# Patient Record
Sex: Female | Born: 1965 | Race: White | Hispanic: No | Marital: Married | Smoking: Never smoker
Health system: Southern US, Community
[De-identification: ages and names within clinical notes are randomized; demographics above are authoritative.]

## PROBLEM LIST (undated history)

## (undated) DIAGNOSIS — G4700 Insomnia, unspecified: Secondary | ICD-10-CM

## (undated) HISTORY — PX: CHOLECYSTECTOMY: SHX55

## (undated) HISTORY — PX: ABDOMINAL HYSTERECTOMY: SHX81

---

## 2013-05-24 ENCOUNTER — Emergency Department (HOSPITAL_COMMUNITY)
Admission: EM | Admit: 2013-05-24 | Discharge: 2013-05-24 | Disposition: A | Discharge status: Home / Self Care | Payer: Self-pay | Attending: Emergency Medicine | Admitting: Emergency Medicine

## 2013-05-24 ENCOUNTER — Emergency Department (HOSPITAL_COMMUNITY): Payer: Self-pay

## 2013-05-24 ENCOUNTER — Encounter (HOSPITAL_COMMUNITY): Payer: Self-pay | Admitting: Neurology

## 2013-05-24 DIAGNOSIS — Z880 Allergy status to penicillin: Secondary | ICD-10-CM | POA: Insufficient documentation

## 2013-05-24 DIAGNOSIS — R197 Diarrhea, unspecified: Secondary | ICD-10-CM | POA: Insufficient documentation

## 2013-05-24 DIAGNOSIS — R002 Palpitations: Secondary | ICD-10-CM | POA: Insufficient documentation

## 2013-05-24 DIAGNOSIS — R110 Nausea: Secondary | ICD-10-CM | POA: Insufficient documentation

## 2013-05-24 DIAGNOSIS — R079 Chest pain, unspecified: Secondary | ICD-10-CM | POA: Insufficient documentation

## 2013-05-24 DIAGNOSIS — Z79899 Other long term (current) drug therapy: Secondary | ICD-10-CM | POA: Insufficient documentation

## 2013-05-24 LAB — COMPREHENSIVE METABOLIC PANEL
ALT: 12 U/L (ref 0–35)
AST: 19 U/L (ref 0–37)
CO2: 29 mEq/L (ref 19–32)
Calcium: 9.1 mg/dL (ref 8.4–10.5)
GFR calc non Af Amer: 79 mL/min — ABNORMAL LOW (ref >90)
Sodium: 140 mEq/L (ref 135–145)
Total Protein: 6.4 g/dL (ref 6.0–8.3)

## 2013-05-24 LAB — POCT I-STAT, CHEM 8
BUN: 15 mg/dL (ref 6–23)
Chloride: 104 mEq/L (ref 96–112)
Creatinine, Ser: 1 mg/dL (ref 0.50–1.10)
Glucose, Bld: 76 mg/dL (ref 70–99)
Potassium: 3.6 mEq/L (ref 3.5–5.1)

## 2013-05-24 LAB — POCT I-STAT TROPONIN I: Troponin i, poc: 0 ng/mL (ref 0.00–0.08)

## 2013-05-24 LAB — CBC
MCH: 28.3 pg (ref 26.0–34.0)
Platelets: 211 10*3/uL (ref 150–400)
RBC: 4.38 MIL/uL (ref 3.87–5.11)

## 2013-05-24 MED ORDER — ACETAMINOPHEN 325 MG PO TABS
650.0000 mg | ORAL_TABLET | Freq: Once | ORAL | Status: AC
  Administered 2013-05-24: 650 mg via ORAL
  Filled 2013-05-24: qty 2

## 2013-05-24 MED ORDER — GI COCKTAIL ~~LOC~~
30.0000 mL | Freq: Once | ORAL | Status: AC
  Administered 2013-05-24: 30 mL via ORAL
  Filled 2013-05-24: qty 30

## 2013-05-24 MED ORDER — ONDANSETRON 4 MG PO TBDP
8.0000 mg | ORAL_TABLET | Freq: Once | ORAL | Status: AC
  Administered 2013-05-24: 8 mg via ORAL

## 2013-05-24 MED ORDER — ONDANSETRON 4 MG PO TBDP
ORAL_TABLET | ORAL | Status: AC
  Filled 2013-05-24: qty 2

## 2013-05-24 NOTE — ED Provider Notes (Signed)
History     CSN: [id number]  Arrival date & time 05/24/13  1413   First MD Initiated Contact with Patient 05/24/13 1414      Chief Complaint  Patient presents with  . Chest Pain    (Consider location/radiation/quality/duration/timing/severity/associated sxs/prior treatment) Patient is a 47 y.o. female presenting with chest pain.  Chest Pain Pain location:  Substernal area Pain quality: stabbing   Pain radiates to:  L shoulder Pain radiates to the back: no   Pain severity:  Severe Onset quality:  Gradual Timing:  Intermittent Progression:  Partially resolved Chronicity:  New Context: breathing   Context: not lifting, no movement, not at rest and no stress   Relieved by:  Nitroglycerin Worsened by:  Nothing tried Ineffective treatments:  None tried Associated symptoms: nausea and palpitations   Associated symptoms: no abdominal pain, no anxiety, no back pain, no cough, no dizziness, no dysphagia, no fatigue, no fever, no numbness, no shortness of breath and not vomiting     History reviewed. No pertinent past medical history.  Past Surgical History  Procedure Laterality Date  . Abdominal hysterectomy      No family history on file.  History  Substance Use Topics  . Smoking status: Not on file  . Smokeless tobacco: Not on file  . Alcohol Use: Not on file    OB History   Grav Para Term Preterm Abortions TAB SAB Ect Mult Living                  Review of Systems  Constitutional: Negative for fever, chills and fatigue.  HENT: Negative for congestion, sore throat, rhinorrhea and trouble swallowing.   Eyes: Negative for photophobia and visual disturbance.  Respiratory: Negative for cough and shortness of breath.   Cardiovascular: Positive for chest pain and palpitations. Negative for leg swelling.  Gastrointestinal: Positive for nausea and diarrhea. Negative for vomiting, abdominal pain and constipation.  Endocrine: Negative for polyphagia and polyuria.   Genitourinary: Negative for dysuria, flank pain, vaginal bleeding, vaginal discharge and enuresis.  Musculoskeletal: Negative for back pain and gait problem.  Skin: Negative for color change and rash.  Neurological: Negative for dizziness, syncope, light-headedness and numbness.  Hematological: Negative for adenopathy. Does not bruise/bleed easily.  All other systems reviewed and are negative.    Allergies  Penicillins  Home Medications   Current Outpatient Rx  Name  Route  Sig  Dispense  Refill  . cholecalciferol (VITAMIN D) 1000 UNITS tablet   Oral   Take 1,000 Units by mouth daily.         . Multiple Vitamin (MULTIVITAMIN WITH MINERALS) TABS   Oral   Take 1 tablet by mouth daily.         Marland Kitchen PRESCRIPTION MEDICATION   Oral   Take 1 tablet by mouth daily as needed (for fluid). Fluid pill         . vitamin B-12 (CYANOCOBALAMIN) 1000 MCG tablet   Oral   Take 1,000 mcg by mouth daily.           BP 124/63  Pulse 75  Temp(Src) 98.2 F (36.8 C) (Oral)  Resp 20  SpO2 100%  Physical Exam  Vitals reviewed. Constitutional: She is oriented to person, place, and time. She appears well-developed and well-nourished.  HENT:  Head: Normocephalic and atraumatic.  Right Ear: External ear normal.  Left Ear: External ear normal.  Eyes: Conjunctivae and EOM are normal. Pupils are equal, round, and reactive to light.  Neck: Normal range of motion. Neck supple.  Cardiovascular: Normal rate, regular rhythm, normal heart sounds and intact distal pulses.   Pulmonary/Chest: Effort normal and breath sounds normal.  Abdominal: Soft. Bowel sounds are normal. There is no tenderness.  Musculoskeletal: Normal range of motion.  Neurological: She is alert and oriented to person, place, and time.  Skin: Skin is warm and dry.    ED Course  Procedures (including critical care time)  Labs Reviewed  CBC  COMPREHENSIVE METABOLIC PANEL  LIPASE, BLOOD   No results found.   No  diagnosis found.   Date: 05/24/2013  Rate: 73  Rhythm: normal sinus rhythm  QRS Axis: normal  Intervals: normal  ST/T Wave abnormalities: normal  Conduction Disutrbances:none  Narrative Interpretation: NSR  Old EKG Reviewed: none available     MDM  47 y.o. female  with pertinent PMH of none presents with chest pain beginning 3 days ago, pleuritic, nonexertional, as described above.  No ho similar.  Pt has also had diarrhea for past 1 week, no nausea.  No fever, cough, or other pain.  Pt states she thinks it might be the heat.  Physical exam as above.  Will check enzymes, cxr, ecg.  Doubt PE: pain intermittent, not associated with each breath, and not exertional, PERC negative.    Pt care transferred to Dr. Fayrene Fearing pending final dispo and delta trop.  Labs and imaging as above reviewed by myself and attending,Dr. Blinda Leatherwood, with whom case was discussed.           Noel Gerold, MD 05/24/13 [phone]

## 2013-05-24 NOTE — ED Provider Notes (Signed)
History     CSN: [id number]  Arrival date & time 05/24/13  1413   First MD Initiated Contact with Patient 05/24/13 1414      Chief Complaint  Patient presents with  . Chest Pain    (Consider location/radiation/quality/duration/timing/severity/associated sxs/prior treatment) HPI  History reviewed. No pertinent past medical history.  Past Surgical History  Procedure Laterality Date  . Abdominal hysterectomy      No family history on file.  History  Substance Use Topics  . Smoking status: Not on file  . Smokeless tobacco: Not on file  . Alcohol Use: Not on file    OB History   Grav Para Term Preterm Abortions TAB SAB Ect Mult Living                  Review of Systems  Allergies  Penicillins  Home Medications   Current Outpatient Rx  Name  Route  Sig  Dispense  Refill  . cholecalciferol (VITAMIN D) 1000 UNITS tablet   Oral   Take 1,000 Units by mouth daily.         . Multiple Vitamin (MULTIVITAMIN WITH MINERALS) TABS   Oral   Take 1 tablet by mouth daily.         Marland Kitchen PRESCRIPTION MEDICATION   Oral   Take 1 tablet by mouth daily as needed (for fluid). Fluid pill         . vitamin B-12 (CYANOCOBALAMIN) 1000 MCG tablet   Oral   Take 1,000 mcg by mouth daily.           BP 124/63  Pulse 75  Temp(Src) 98.2 F (36.8 C) (Oral)  Resp 20  SpO2 100%  Physical Exam  ED Course  Procedures (including critical care time)  Labs Reviewed  CBC - Abnormal; Notable for the following:    HCT 35.9 (*)    All other components within normal limits  COMPREHENSIVE METABOLIC PANEL - Abnormal; Notable for the following:    GFR calc non Af Amer 79 (*)    All other components within normal limits  LIPASE, BLOOD  POCT I-STAT, CHEM 8  POCT I-STAT TROPONIN I  POCT I-STAT TROPONIN I   Dg Chest 2 View  05/24/2013   *RADIOLOGY REPORT*  Clinical Data: Chest pain  CHEST - 2 VIEW  Comparison: 10/20/2011  Findings: The heart size and mediastinal contours are  within normal limits.  Both lungs are clear.  The visualized skeletal structures are unremarkable.  IMPRESSION: Negative exam.   Original Report Authenticated By: Signa Kell, M.D.     No diagnosis found.    MDM  4:12 PM care assumed from Dr. Littie Deeds of this pt here with intermittent sharp chest pain. Was given nitro en route with improvement in pain. Overall pt is low risk and story does not sound c/w ACS. Initial tn neg. Will f/u delta tn.   7:01 PM delta tn neg. Pt has remained chest pain free. She will call her PCP tomorrow for recheck and further evaluation of chest pain. She voiced understanding and dc'd home in stable condition.       Caren Hazy, MD 05/24/13 1901

## 2013-05-24 NOTE — ED Notes (Signed)
Per EMS- Reporting central cp x 3 days intermittently. Skin warm and dry. Today initially 10/10 while at work. Reports nausea, no cardiac hx. Lung sounds clear, reports radiation to left side. Given 4 mg zofran, 3 nitro, after nitro 5/10 cp. 324 aspirin. Reports pain worse with deep inspiration. 130/90, HR 74, RR 20. A x 4.

## 2013-05-24 NOTE — ED Provider Notes (Signed)
I saw and evaluated the patient, reviewed the resident's note and I agree with the findings and plan.  Seen and evaluated. Patient's chest pain symptoms are atypical. Patient is experiencing intermittent pain for 3 days that is nonexertional in nature. Patient has no cardiac risk factors. She is very low probability for coronary artery disease causing her symptoms. It was felt it was appropriate to have 2 sets of enzymes here in the ER, if negative followup as an outpatient.  Agree with resident interpretation of EKG.  Gilda Crease, MD 05/24/13 1743

## 2013-05-27 NOTE — ED Provider Notes (Signed)
I saw and evaluated the patient, reviewed the resident's note and I agree with the findings and plan.    Gilda Crease, MD 05/27/13 [phone]

## 2014-06-13 ENCOUNTER — Emergency Department (HOSPITAL_COMMUNITY): Payer: BC Managed Care – PPO

## 2014-06-13 ENCOUNTER — Encounter (HOSPITAL_COMMUNITY): Payer: Self-pay | Admitting: Emergency Medicine

## 2014-06-13 ENCOUNTER — Emergency Department (HOSPITAL_COMMUNITY)
Admission: EM | Admit: 2014-06-13 | Discharge: 2014-06-13 | Disposition: A | Discharge status: Home / Self Care | Payer: BC Managed Care – PPO | Attending: Emergency Medicine | Admitting: Emergency Medicine

## 2014-06-13 DIAGNOSIS — IMO0002 Reserved for concepts with insufficient information to code with codable children: Secondary | ICD-10-CM | POA: Insufficient documentation

## 2014-06-13 DIAGNOSIS — R61 Generalized hyperhidrosis: Secondary | ICD-10-CM | POA: Insufficient documentation

## 2014-06-13 DIAGNOSIS — R5383 Other fatigue: Principal | ICD-10-CM

## 2014-06-13 DIAGNOSIS — R609 Edema, unspecified: Secondary | ICD-10-CM | POA: Insufficient documentation

## 2014-06-13 DIAGNOSIS — T380X5A Adverse effect of glucocorticoids and synthetic analogues, initial encounter: Secondary | ICD-10-CM | POA: Insufficient documentation

## 2014-06-13 DIAGNOSIS — Z792 Long term (current) use of antibiotics: Secondary | ICD-10-CM | POA: Insufficient documentation

## 2014-06-13 DIAGNOSIS — R5381 Other malaise: Secondary | ICD-10-CM | POA: Insufficient documentation

## 2014-06-13 DIAGNOSIS — Z79899 Other long term (current) drug therapy: Secondary | ICD-10-CM | POA: Insufficient documentation

## 2014-06-13 DIAGNOSIS — Z880 Allergy status to penicillin: Secondary | ICD-10-CM | POA: Insufficient documentation

## 2014-06-13 DIAGNOSIS — N390 Urinary tract infection, site not specified: Secondary | ICD-10-CM | POA: Insufficient documentation

## 2014-06-13 DIAGNOSIS — R42 Dizziness and giddiness: Secondary | ICD-10-CM | POA: Insufficient documentation

## 2014-06-13 DIAGNOSIS — R0602 Shortness of breath: Secondary | ICD-10-CM | POA: Insufficient documentation

## 2014-06-13 DIAGNOSIS — R300 Dysuria: Secondary | ICD-10-CM | POA: Insufficient documentation

## 2014-06-13 DIAGNOSIS — Z87440 Personal history of urinary (tract) infections: Secondary | ICD-10-CM

## 2014-06-13 LAB — COMPREHENSIVE METABOLIC PANEL
ALT: 13 U/L (ref 0–35)
AST: 17 U/L (ref 0–37)
Albumin: 3.5 g/dL (ref 3.5–5.2)
Alkaline Phosphatase: 111 U/L (ref 39–117)
Anion gap: 15 (ref 5–15)
BUN: 15 mg/dL (ref 6–23)
CALCIUM: 9 mg/dL (ref 8.4–10.5)
CO2: 24 meq/L (ref 19–32)
CREATININE: 0.66 mg/dL (ref 0.50–1.10)
Chloride: 102 mEq/L (ref 96–112)
Glucose, Bld: 119 mg/dL — ABNORMAL HIGH (ref 70–99)
Potassium: 4 mEq/L (ref 3.7–5.3)
Sodium: 141 mEq/L (ref 137–147)
TOTAL PROTEIN: 6.5 g/dL (ref 6.0–8.3)
Total Bilirubin: 0.3 mg/dL (ref 0.3–1.2)

## 2014-06-13 LAB — CBC WITH DIFFERENTIAL/PLATELET
BASOS ABS: 0 10*3/uL (ref 0.0–0.1)
Basophils Relative: 0 % (ref 0–1)
EOS ABS: 0.1 10*3/uL (ref 0.0–0.7)
EOS PCT: 1 % (ref 0–5)
HEMATOCRIT: 40.3 % (ref 36.0–46.0)
Hemoglobin: 13.6 g/dL (ref 12.0–15.0)
LYMPHS PCT: 14 % (ref 12–46)
Lymphs Abs: 2.3 10*3/uL (ref 0.7–4.0)
MCH: 28.5 pg (ref 26.0–34.0)
MCHC: 33.7 g/dL (ref 30.0–36.0)
MCV: 84.3 fL (ref 78.0–100.0)
MONO ABS: 0.6 10*3/uL (ref 0.1–1.0)
Monocytes Relative: 3 % (ref 3–12)
Neutro Abs: 14.1 10*3/uL — ABNORMAL HIGH (ref 1.7–7.7)
Neutrophils Relative %: 82 % — ABNORMAL HIGH (ref 43–77)
PLATELETS: 216 10*3/uL (ref 150–400)
RBC: 4.78 MIL/uL (ref 3.87–5.11)
RDW: 14 % (ref 11.5–15.5)
WBC: 17 10*3/uL — ABNORMAL HIGH (ref 4.0–10.5)

## 2014-06-13 LAB — URINALYSIS, ROUTINE W REFLEX MICROSCOPIC
BILIRUBIN URINE: NEGATIVE
GLUCOSE, UA: NEGATIVE mg/dL
Hgb urine dipstick: NEGATIVE
KETONES UR: NEGATIVE mg/dL
Leukocytes, UA: NEGATIVE
Nitrite: NEGATIVE
Protein, ur: NEGATIVE mg/dL
Specific Gravity, Urine: 1.024 (ref 1.005–1.030)
Urobilinogen, UA: 1 mg/dL (ref 0.0–1.0)
pH: 6.5 (ref 5.0–8.0)

## 2014-06-13 LAB — LACTIC ACID, PLASMA: Lactic Acid, Venous: 1.7 mmol/L (ref 0.5–2.2)

## 2014-06-13 MED ORDER — PHENAZOPYRIDINE HCL 200 MG PO TABS: 200.0000 mg | ORAL_TABLET | Freq: Three times a day (TID) | ORAL | Status: AC | PRN

## 2014-06-13 MED ORDER — ACETAMINOPHEN 325 MG PO TABS
650.0000 mg | ORAL_TABLET | Freq: Once | ORAL | Status: AC
  Administered 2014-06-13: 650 mg via ORAL
  Filled 2014-06-13: qty 2

## 2014-06-13 MED ORDER — SODIUM CHLORIDE 0.9 % IV BOLUS (SEPSIS)
1000.0000 mL | Freq: Once | INTRAVENOUS | Status: AC
Start: 2014-06-13 — End: 2014-06-13
  Administered 2014-06-13: 1000 mL via INTRAVENOUS

## 2014-06-13 NOTE — ED Notes (Signed)
Dr Beaton at bedside 

## 2014-06-13 NOTE — ED Notes (Signed)
Pt reports generalized weakness x 1 week.  Seen by PCP last week, dx with kidney infection.  Pt given "sulfa- drug;" reports tightness in throat so PCP stopped antibiotics for possible allergic reaction. Prednisone given after allergic reaction. Pt c/o weakness, diaphoresis worsened today. Reports sitting at desk at work when felt lightheaded, increased shortness of breath, and diaphoretic. Also c/o decreased appetite and nausea.

## 2014-06-13 NOTE — ED Notes (Signed)
Pt having increased shortness of breath after ambulating.  Respirations 30pm afte ambulation. Pt reports "my legs feel like 50 lb weights. I've never felt so weak." Resident aware of patient condition

## 2014-06-13 NOTE — ED Notes (Addendum)
Pt presents with 10 day h/o generalized weakness. Pt seen at PCP, had medication changed, [facility], was told she had a kidney infection.  Pt reports today, symptoms worsened.  Pt reports at onset, PCP thought pt had allergic reaction, was put on prednisone; pt reports intermittent shortness of breath.  Pt reports tick bite to L thigh x 1 week ago

## 2014-06-13 NOTE — ED Notes (Signed)
Dr.Ahmed, Resident at bedside

## 2014-06-13 NOTE — ED Provider Notes (Signed)
I saw and evaluated the patient, reviewed the resident's note and I agree with the findings and plan.   .Face to face Exam:  General:  Awake HEENT:  Atraumatic Resp:  Normal effort Abd:  Nondistended Neuro:No focal weakness  Nelia Shiobert L Lewin Pellow, MD 06/13/14 [phone]

## 2014-06-13 NOTE — Discharge Instructions (Signed)

## 2014-06-13 NOTE — ED Provider Notes (Signed)
CSN: [id number]     Arrival date & time 06/13/14  1325 History   First MD Initiated Contact with Patient 06/13/14 1504     No chief complaint on file.    (Consider location/radiation/quality/duration/timing/severity/associated sxs/prior Treatment) HPI  Patient is 48 yo female with recent UTI diagnosed by PCP who comes in with generalized weakness, diaphoresis, dizziness, and SOB. She was dxed with UTI by her PCP on 06/03/14, was started on a sulfa abx. She developed allergic reaction to the abx and went to urgent care on 06/04/14. Her abx was changed to cipro and she was started on PO prednisone. She continued to feel that she was having allergic reaction and felt her body was burning. Went to ED on 06/07/14 and received IV steroid and was told to continue her abx. She went back to ED on 06/11/14 for persistent feeling of malaise and burning/pain with voiding. Per patient, CT scan was done and it showed infection of the kidney. She was told to wean off prednisone and to continue abx.   Currently patient is taking 1 prednisone tablet daily and cipro 500mg  BID. She continues to have pain with urination and foul smelling urine, pain on her left flank, headache, diaphoresis, generalized weakness, nausea, and malaise.   History reviewed. No pertinent past medical history. Past Surgical History  Procedure Laterality Date  . Abdominal hysterectomy     History reviewed. No pertinent family history. History  Substance Use Topics  . Smoking status: Never Smoker   . Smokeless tobacco: Not on file  . Alcohol Use: Not on file   OB History   Grav Para Term Preterm Abortions TAB SAB Ect Mult Living                 Review of Systems  Constitutional: Positive for diaphoresis, appetite change and fatigue.  HENT: Positive for congestion. Negative for ear discharge, ear pain, facial swelling, postnasal drip and sinus pressure.   Eyes: Negative for pain, discharge and redness.  Respiratory: Positive for  shortness of breath. Negative for apnea, cough, chest tightness and wheezing.   Cardiovascular: Positive for leg swelling. Negative for chest pain and palpitations.  Genitourinary: Positive for dysuria, flank pain, difficulty urinating and pelvic pain. Negative for vaginal bleeding and vaginal discharge.  Musculoskeletal: Negative for back pain, joint swelling, myalgias, neck pain and neck stiffness.  Skin: Positive for rash.  Allergic/Immunologic: Negative.   Neurological: Positive for dizziness, light-headedness and headaches. Negative for tremors, seizures, syncope, facial asymmetry, speech difficulty and numbness.  Psychiatric/Behavioral: Negative for hallucinations, confusion and agitation.      Allergies  Penicillins and Sulfa antibiotics  Home Medications   Prior to Admission medications   Medication Sig Start Date End Date Taking? Authorizing Provider  cholecalciferol (VITAMIN D) 1000 UNITS tablet Take 1,000 Units by mouth daily.   Yes Historical Provider, MD  Cimetidine (TAGAMET PO) Take 1 tablet by mouth 2 (two) times daily.   Yes Historical Provider, MD  ciprofloxacin (CIPRO) 500 MG tablet Take 500 mg by mouth 2 (two) times daily. 8 days left   Yes Historical Provider, MD  clindamycin (CLEOCIN) 150 MG capsule Take 150 mg by mouth 3 (three) times daily.   Yes Historical Provider, MD  diphenhydrAMINE (BENADRYL) 25 MG tablet Take 25 mg by mouth every 4 (four) hours as needed.   Yes Historical Provider, MD  furosemide (LASIX) 20 MG tablet Take 20 mg by mouth daily.   Yes Historical Provider, MD  Multiple Vitamin (MULTIVITAMIN WITH  MINERALS) TABS Take 1 tablet by mouth daily.   Yes Historical Provider, MD  phentermine (ADIPEX-P) 37.5 MG tablet Take 37.5 mg by mouth daily before breakfast.   Yes Historical Provider, MD  predniSONE (DELTASONE) 10 MG tablet Take 10 mg by mouth daily with breakfast.   Yes Historical Provider, MD   BP 123/65  Pulse 69  Temp(Src) 97.7 F (36.5 C)  (Oral)  Resp 24  SpO2 98% Physical Exam  Nursing note and vitals reviewed. Constitutional: She is oriented to person, place, and time. She appears well-developed and well-nourished. No distress.  HENT:  Head: Normocephalic and atraumatic.  Right Ear: External ear normal.  Left Ear: External ear normal.  Nose: Nose normal.  Mouth/Throat: Oropharynx is clear and moist.  Eyes: Conjunctivae and EOM are normal. Pupils are equal, round, and reactive to light. Right eye exhibits no discharge. Left eye exhibits no discharge. No scleral icterus.  Neck: Normal range of motion. Neck supple. No JVD present. No thyromegaly present.  Cardiovascular: Normal rate, regular rhythm, normal heart sounds and intact distal pulses.  Exam reveals no gallop and no friction rub.   No murmur heard. Pulmonary/Chest: Effort normal and breath sounds normal. No respiratory distress. She has no wheezes. She has no rales. She exhibits no tenderness.  Abdominal: Soft. Bowel sounds are normal. She exhibits no distension and no mass. There is tenderness in the suprapubic area. There is no rebound and no guarding.  CVA tenderness noted on Left.   Musculoskeletal: She exhibits no edema and no tenderness.  Bilateral 1+ pitting edema on lower extremities.  Lymphadenopathy:    She has no cervical adenopathy.  Neurological: She is alert and oriented to person, place, and time. She has normal strength and normal reflexes. No cranial nerve deficit or sensory deficit.  Skin: Skin is warm. No rash noted. She is diaphoretic. No cyanosis. No pallor.  Psychiatric: She has a normal mood and affect. Her behavior is normal.    ED Course  Procedures (including critical care time) Labs Review Labs Reviewed  CBC WITH DIFFERENTIAL - Abnormal; Notable for the following:    WBC 17.0 (*)    Neutrophils Relative % 82 (*)    Neutro Abs 14.1 (*)    All other components within normal limits  COMPREHENSIVE METABOLIC PANEL - Abnormal; Notable  for the following:    Glucose, Bld 119 (*)    All other components within normal limits  URINE CULTURE  URINALYSIS, ROUTINE W REFLEX MICROSCOPIC  LACTIC ACID, PLASMA    Imaging Review Dg Chest 2 View  06/13/2014   CLINICAL DATA:  Low grade fever.  EXAM: CHEST  2 VIEW  COMPARISON:  CT chest and PA and lateral chest 06/11/2014.  FINDINGS: Heart size and mediastinal contours are within normal limits. Both lungs are clear. Visualized skeletal structures are unremarkable.  IMPRESSION: Negative exam.   Electronically Signed   By: Drusilla Kannerhomas  Dalessio M.D.   On: 06/13/2014 14:40     EKG Interpretation None     EKG was NSR. Vitals are normal. Not having chest pain - didn't do trp.  CXR was normal.  CBC showed leukocytosis 17 with left shift.  CMP was normal.   Ordered UA and lactic acid. Bolused 1 L fluid. Gave tylenol for HA.   Looked at old records form [place], ct angio there ws negative, WBC was 20's there on 06/11/14.  Will order ct Abd pelvis w/o contrast to evaluate for ongoing dysuria.  CT was negative.  MDM   Final diagnoses:  None   Her overall feeling of malaise is likely the side effect of prednisone. Her WBC increased to 20's after starting prednisone per previous records and it's elevated here to 17. Other labs look normal. Will stop prednisone. Continue cipro to finish her dose for UTI.  She continues to have dysuria despite UTI tx. She has hx of UTI 2-3 months ago. Will refer to urology outpatient to evaluation of recurrent symptomatic UTI.    Hyacinth Meeker, MD 06/13/14 [id number]  Hyacinth Meeker, MD 06/13/14 1191

## 2014-06-14 LAB — URINE CULTURE
COLONY COUNT: NO GROWTH
CULTURE: NO GROWTH

## 2014-07-20 ENCOUNTER — Emergency Department (HOSPITAL_BASED_OUTPATIENT_CLINIC_OR_DEPARTMENT_OTHER)
Admission: EM | Admit: 2014-07-20 | Discharge: 2014-07-20 | Disposition: A | Discharge status: Home / Self Care | Payer: BC Managed Care – PPO | Attending: Emergency Medicine | Admitting: Emergency Medicine

## 2014-07-20 ENCOUNTER — Encounter (HOSPITAL_BASED_OUTPATIENT_CLINIC_OR_DEPARTMENT_OTHER): Payer: Self-pay | Admitting: Emergency Medicine

## 2014-07-20 DIAGNOSIS — IMO0002 Reserved for concepts with insufficient information to code with codable children: Secondary | ICD-10-CM | POA: Insufficient documentation

## 2014-07-20 DIAGNOSIS — Z79899 Other long term (current) drug therapy: Secondary | ICD-10-CM | POA: Insufficient documentation

## 2014-07-20 DIAGNOSIS — L259 Unspecified contact dermatitis, unspecified cause: Secondary | ICD-10-CM | POA: Diagnosis not present

## 2014-07-20 DIAGNOSIS — Y9389 Activity, other specified: Secondary | ICD-10-CM | POA: Insufficient documentation

## 2014-07-20 DIAGNOSIS — Z880 Allergy status to penicillin: Secondary | ICD-10-CM | POA: Diagnosis not present

## 2014-07-20 DIAGNOSIS — R21 Rash and other nonspecific skin eruption: Secondary | ICD-10-CM | POA: Insufficient documentation

## 2014-07-20 DIAGNOSIS — Z792 Long term (current) use of antibiotics: Secondary | ICD-10-CM | POA: Diagnosis not present

## 2014-07-20 DIAGNOSIS — Y929 Unspecified place or not applicable: Secondary | ICD-10-CM | POA: Diagnosis not present

## 2014-07-20 DIAGNOSIS — B369 Superficial mycosis, unspecified: Secondary | ICD-10-CM

## 2014-07-20 DIAGNOSIS — Z87440 Personal history of urinary (tract) infections: Secondary | ICD-10-CM | POA: Insufficient documentation

## 2014-07-20 MED ORDER — PREDNISONE 10 MG PO TABS: ORAL_TABLET | ORAL | Status: AC

## 2014-07-20 MED ORDER — NYSTATIN 100000 UNIT/GM EX POWD: 1.0000 g | Freq: Two times a day (BID) | CUTANEOUS | Status: AC

## 2014-07-20 NOTE — ED Notes (Signed)
Pt states she got bit by something Tuesday on left arm. Now has rash on AC area, and on left side of face and behind back of ear.

## 2014-07-20 NOTE — Discharge Instructions (Signed)
Take the prednisone pack in coordination with your other prednisone pack. Take 6 tablets x2 days, 5 tablets x2 days, 4 tablets x2 days, 3 tablets x2 days, one tablet x2 days. Apply topical antifungal cream on the rash on your stomach and groin.  Contact Dermatitis Contact dermatitis is a reaction to certain substances that touch the skin. Contact dermatitis can be either irritant contact dermatitis or allergic contact dermatitis. Irritant contact dermatitis does not require previous exposure to the substance for a reaction to occur.Allergic contact dermatitis only occurs if you have been exposed to the substance before. Upon a repeat exposure, your body reacts to the substance.  CAUSES  Many substances can cause contact dermatitis. Irritant dermatitis is most commonly caused by repeated exposure to mildly irritating substances, such as:  Makeup.  Soaps.  Detergents.  Bleaches.  Acids.  Metal salts, such as nickel. Allergic contact dermatitis is most commonly caused by exposure to:  Poisonous plants.  Chemicals (deodorants, shampoos).  Jewelry.  Latex.  Neomycin in triple antibiotic cream.  Preservatives in products, including clothing. SYMPTOMS  The area of skin that is exposed may develop:  Dryness or flaking.  Redness.  Cracks.  Itching.  Pain or a burning sensation.  Blisters. With allergic contact dermatitis, there may also be swelling in areas such as the eyelids, mouth, or genitals.  DIAGNOSIS  Your caregiver can usually tell what the problem is by doing a physical exam. In cases where the cause is uncertain and an allergic contact dermatitis is suspected, a patch skin test may be performed to help determine the cause of your dermatitis. TREATMENT Treatment includes protecting the skin from further contact with the irritating substance by avoiding that substance if possible. Barrier creams, powders, and gloves may be helpful. Your caregiver may also  recommend:  Steroid creams or ointments applied 2 times daily. For best results, soak the rash area in cool water for 20 minutes. Then apply the medicine. Cover the area with a plastic wrap. You can store the steroid cream in the refrigerator for a "chilly" effect on your rash. That may decrease itching. Oral steroid medicines may be needed in more severe cases.  Antibiotics or antibacterial ointments if a skin infection is present.  Antihistamine lotion or an antihistamine taken by mouth to ease itching.  Lubricants to keep moisture in your skin.  Burow's solution to reduce redness and soreness or to dry a weeping rash. Mix one packet or tablet of solution in 2 cups cool water. Dip a clean washcloth in the mixture, wring it out a bit, and put it on the affected area. Leave the cloth in place for 30 minutes. Do this as often as possible throughout the day.  Taking several cornstarch or baking soda baths daily if the area is too large to cover with a washcloth. Harsh chemicals, such as alkalis or acids, can cause skin damage that is like a burn. You should flush your skin for 15 to 20 minutes with cold water after such an exposure. You should also seek immediate medical care after exposure. Bandages (dressings), antibiotics, and pain medicine may be needed for severely irritated skin.  HOME CARE INSTRUCTIONS  Avoid the substance that caused your reaction.  Keep the area of skin that is affected away from hot water, soap, sunlight, chemicals, acidic substances, or anything else that would irritate your skin.  Do not scratch the rash. Scratching may cause the rash to become infected.  You may take cool baths to help  stop the itching.  Only take over-the-counter or prescription medicines as directed by your caregiver.  See your caregiver for follow-up care as directed to make sure your skin is healing properly. SEEK MEDICAL CARE IF:   Your condition is not better after 3 days of  treatment.  You seem to be getting worse.  You see signs of infection such as swelling, tenderness, redness, soreness, or warmth in the affected area.  You have any problems related to your medicines. Document Released: 11/19/2000 Document Revised: 02/14/2012 Document Reviewed: 04/27/2011 [facility] Patient Information 2015 [place], [place]. This information is not intended to replace advice given to you by your health care provider. Make sure you discuss any questions you have with your health care provider.  Rash A rash is a change in the color or texture of your skin. There are many different types of rashes. You may have other problems that accompany your rash. CAUSES   Infections.  Allergic reactions. This can include allergies to pets or foods.  Certain medicines.  Exposure to certain chemicals, soaps, or cosmetics.  Heat.  Exposure to poisonous plants.  Tumors, both cancerous and noncancerous. SYMPTOMS   Redness.  Scaly skin.  Itchy skin.  Dry or cracked skin.  Bumps.  Blisters.  Pain. DIAGNOSIS  Your caregiver may do a physical exam to determine what type of rash you have. A skin sample (biopsy) may be taken and examined under a microscope. TREATMENT  Treatment depends on the type of rash you have. Your caregiver may prescribe certain medicines. For serious conditions, you may need to see a skin doctor (dermatologist). HOME CARE INSTRUCTIONS   Avoid the substance that caused your rash.  Do not scratch your rash. This can cause infection.  You may take cool baths to help stop itching.  Only take over-the-counter or prescription medicines as directed by your caregiver.  Keep all follow-up appointments as directed by your caregiver. SEEK IMMEDIATE MEDICAL CARE IF:  You have increasing pain, swelling, or redness.  You have a fever.  You have new or severe symptoms.  You have body aches, diarrhea, or vomiting.  Your rash is not better after 3  days. MAKE SURE YOU:  Understand these instructions.  Will watch your condition.  Will get help right away if you are not doing well or get worse. Document Released: 11/12/2002 Document Revised: 02/14/2012 Document Reviewed: 09/06/2011 [facility] Patient Information 2015 [place], [place]. This information is not intended to replace advice given to you by your health care provider. Make sure you discuss any questions you have with your health care provider.

## 2014-07-20 NOTE — ED Provider Notes (Signed)
CSN: [id number]     Arrival date & time 07/20/14  1934 History   First MD Initiated Contact with Patient 07/20/14 2022     Chief Complaint  Patient presents with  . Rash     (Consider location/radiation/quality/duration/timing/severity/associated sxs/prior Treatment) HPI Comments: Patient is a 48 year old female with a past medical history of chronic urinary tract infections who presents to the emergency department complaining of a rash x5 days, gradually worsening. Patient reports she was outside walking in the woods when she felt a bite on her left arm 5 days ago. When she got home she noticed a rash in her elbow fold, and the next day it spread to the right side of her face, around her eye and back of her ear. She saw her primary care doctor who gave her a shot of steroids and started her on prednisone and instructed her to take Benadryl. The next day, the rash spread to her abdomen under her pannus fold along with her right groin area. The rash on her arm and abdomen is itchy, however she feels like her face is burning. She reports no relief with the steroids and Benadryl. She was told by her primary care physician that she had a urinary tract infection and was started on same, however she has been on this medication in the past without any problem. Denies difficulty breathing or swallowing. Denies fever or chills.  Patient is a 48 y.o. female presenting with rash. The history is provided by the patient.  Rash   History reviewed. No pertinent past medical history. Past Surgical History  Procedure Laterality Date  . Abdominal hysterectomy    . Cholecystectomy     History reviewed. No pertinent family history. History  Substance Use Topics  . Smoking status: Never Smoker   . Smokeless tobacco: Not on file  . Alcohol Use: No   OB History   Grav Para Term Preterm Abortions TAB SAB Ect Mult Living                 Review of Systems  HENT: Positive for facial swelling.   Skin:  Positive for rash.  All other systems reviewed and are negative.     Allergies  Penicillins and Sulfa antibiotics  Home Medications   Prior to Admission medications   Medication Sig Start Date End Date Taking? Authorizing Provider  levofloxacin (LEVAQUIN) 750 MG tablet Take 750 mg by mouth daily.   Yes Historical Provider, MD  cholecalciferol (VITAMIN D) 1000 UNITS tablet Take 1,000 Units by mouth daily.    Historical Provider, MD  Cimetidine (TAGAMET PO) Take 1 tablet by mouth 2 (two) times daily.    Historical Provider, MD  ciprofloxacin (CIPRO) 500 MG tablet Take 500 mg by mouth 2 (two) times daily. 8 days left    Historical Provider, MD  clindamycin (CLEOCIN) 150 MG capsule Take 150 mg by mouth 3 (three) times daily.    Historical Provider, MD  diphenhydrAMINE (BENADRYL) 25 MG tablet Take 25 mg by mouth every 4 (four) hours as needed.    Historical Provider, MD  furosemide (LASIX) 20 MG tablet Take 20 mg by mouth daily.    Historical Provider, MD  Multiple Vitamin (MULTIVITAMIN WITH MINERALS) TABS Take 1 tablet by mouth daily.    Historical Provider, MD  nystatin (MYCOSTATIN/NYSTOP) 100000 UNIT/GM POWD Apply 1 g topically 2 (two) times daily. 07/20/14   Trevor Maceobyn M Albert, PA-C  phenazopyridine (PYRIDIUM) 200 MG tablet Take 1 tablet (200 mg total) by  mouth 3 (three) times daily as needed for pain (for dysuria). 06/13/14   Tasrif Ahmed, MD  phentermine (ADIPEX-P) 37.5 MG tablet Take 37.5 mg by mouth daily before breakfast.    Historical Provider, MD  predniSONE (DELTASONE) 10 MG tablet Take this in coordination with your other dose pack. 07/20/14   Trevor Mace, PA-C   BP 141/81  Pulse 90  Temp(Src) 98.3 F (36.8 C) (Oral)  Resp 20  Ht 5\' 5"  (1.651 m)  Wt 256 lb (116.121 kg)  BMI 42.60 kg/m2  SpO2 99% Physical Exam  Nursing note and vitals reviewed. Constitutional: She is oriented to person, place, and time. She appears well-developed and well-nourished. No distress.  Obese.   HENT:  Head: Normocephalic and atraumatic.  Mouth/Throat: Oropharynx is clear and moist.  Eyes: Conjunctivae are normal.  Neck: Normal range of motion. Neck supple.  Cardiovascular: Normal rate, regular rhythm and normal heart sounds.   Pulmonary/Chest: Effort normal and breath sounds normal.  Musculoskeletal: Normal range of motion. She exhibits no edema.  Neurological: She is alert and oriented to person, place, and time.  Skin: Skin is warm and dry. She is not diaphoretic.  Moist, erythematous rash under pannus on right side of abdomen and in right groin area. No signs of secondary infection. Erythematous maculopapular rash around antecubital fold of left elbow, small area on the forearm and upper arm consistent with contact dermatitis. No signs of secondary infection. Rash spares palms of hands. No mucosal lesions. Erythema and swelling noted to right side of face and around right eye. No eye involvement.  Psychiatric: She has a normal mood and affect. Her behavior is normal.    ED Course  Procedures (including critical care time) Labs Review Labs Reviewed - No data to display  Imaging Review No results found.   EKG Interpretation None      MDM   Final diagnoses:  Contact dermatitis  Fungal rash of torso   Patient presenting with worsening rash. She is well appearing and in no apparent distress. AFVSS. No respiratory or airway compromise. Rash consistent in appearance with contact dermatitis on arm and face, this is consistent with patient recently being in the woods. Plan to increase dose of prednisone and extend course to 12 days rather than 6. Regarding rash under pannus and groin area, will treat with nystatin powder. Advised continue benadryl. F/u with PCP. Stable for d/c. Return precautions given. Patient states understanding of treatment care plan and is agreeable.  Case discussed with attending Dr. Rosalia Hammers who also evaluated patient and agrees with plan of  care.   Trevor Mace, PA-C 07/20/14 2353

## 2014-07-22 NOTE — ED Provider Notes (Signed)
48 y.o. Female with rash which has been worsening and present intermittently for weeks.  Today the rash on her face appears to be contact dermatitis, the rash under her breasts intertriginous candida, and the lesions on her arm c.w. Bite and some with contact dermatitis.  She appear uncomfortable from the facial rash but ow stable.  I have discussed ddx, treatment and need for follow up with patient and family and they voice understanding of follow up and return precautions.   Hilario Quarryanielle S Darrelle Wiberg, MD 07/22/14 1153

## 2015-09-03 ENCOUNTER — Encounter (HOSPITAL_COMMUNITY): Payer: Self-pay | Admitting: Vascular Surgery

## 2015-09-03 ENCOUNTER — Emergency Department (HOSPITAL_COMMUNITY)
Admission: EM | Admit: 2015-09-03 | Discharge: 2015-09-03 | Disposition: A | Discharge status: Home / Self Care | Payer: BLUE CROSS/BLUE SHIELD | Attending: Emergency Medicine | Admitting: Emergency Medicine

## 2015-09-03 DIAGNOSIS — Z880 Allergy status to penicillin: Secondary | ICD-10-CM | POA: Insufficient documentation

## 2015-09-03 DIAGNOSIS — R590 Localized enlarged lymph nodes: Secondary | ICD-10-CM | POA: Insufficient documentation

## 2015-09-03 DIAGNOSIS — Z791 Long term (current) use of non-steroidal anti-inflammatories (NSAID): Secondary | ICD-10-CM | POA: Insufficient documentation

## 2015-09-03 DIAGNOSIS — Z7952 Long term (current) use of systemic steroids: Secondary | ICD-10-CM | POA: Insufficient documentation

## 2015-09-03 DIAGNOSIS — Z792 Long term (current) use of antibiotics: Secondary | ICD-10-CM | POA: Insufficient documentation

## 2015-09-03 DIAGNOSIS — Z8669 Personal history of other diseases of the nervous system and sense organs: Secondary | ICD-10-CM | POA: Insufficient documentation

## 2015-09-03 DIAGNOSIS — R51 Headache: Secondary | ICD-10-CM | POA: Diagnosis present

## 2015-09-03 DIAGNOSIS — H6502 Acute serous otitis media, left ear: Secondary | ICD-10-CM

## 2015-09-03 HISTORY — DX: Insomnia, unspecified: G47.00

## 2015-09-03 LAB — CBC
HCT: 38.1 % (ref 36.0–46.0)
HEMOGLOBIN: 12.8 g/dL (ref 12.0–15.0)
MCH: 28.1 pg (ref 26.0–34.0)
MCHC: 33.6 g/dL (ref 30.0–36.0)
MCV: 83.7 fL (ref 78.0–100.0)
Platelets: 219 10*3/uL (ref 150–400)
RBC: 4.55 MIL/uL (ref 3.87–5.11)
RDW: 13.8 % (ref 11.5–15.5)
WBC: 7.4 10*3/uL (ref 4.0–10.5)

## 2015-09-03 LAB — DIFFERENTIAL
BASOS ABS: 0 10*3/uL (ref 0.0–0.1)
BASOS PCT: 0 %
EOS PCT: 2 %
Eosinophils Absolute: 0.2 10*3/uL (ref 0.0–0.7)
Lymphocytes Relative: 30 %
Lymphs Abs: 2.2 10*3/uL (ref 0.7–4.0)
MONO ABS: 0.4 10*3/uL (ref 0.1–1.0)
MONOS PCT: 5 %
Neutro Abs: 4.7 10*3/uL (ref 1.7–7.7)
Neutrophils Relative %: 63 %

## 2015-09-03 LAB — URINALYSIS, ROUTINE W REFLEX MICROSCOPIC
Bilirubin Urine: NEGATIVE
GLUCOSE, UA: NEGATIVE mg/dL
Hgb urine dipstick: NEGATIVE
Ketones, ur: NEGATIVE mg/dL
Nitrite: NEGATIVE
PROTEIN: NEGATIVE mg/dL
SPECIFIC GRAVITY, URINE: 1.028 (ref 1.005–1.030)
Urobilinogen, UA: 0.2 mg/dL (ref 0.0–1.0)
pH: 5.5 (ref 5.0–8.0)

## 2015-09-03 LAB — COMPREHENSIVE METABOLIC PANEL
ALBUMIN: 3.7 g/dL (ref 3.5–5.0)
ALK PHOS: 103 U/L (ref 38–126)
ALT: 16 U/L (ref 14–54)
AST: 24 U/L (ref 15–41)
Anion gap: 5 (ref 5–15)
BUN: 15 mg/dL (ref 6–20)
CALCIUM: 9.2 mg/dL (ref 8.9–10.3)
CHLORIDE: 108 mmol/L (ref 101–111)
CO2: 28 mmol/L (ref 22–32)
CREATININE: 0.64 mg/dL (ref 0.44–1.00)
GFR calc Af Amer: >60 mL/min (ref >60)
GFR calc non Af Amer: >60 mL/min (ref >60)
GLUCOSE: 89 mg/dL (ref 65–99)
Potassium: 4.4 mmol/L (ref 3.5–5.1)
Sodium: 141 mmol/L (ref 135–145)
Total Bilirubin: 0.6 mg/dL (ref 0.3–1.2)
Total Protein: 6.3 g/dL — ABNORMAL LOW (ref 6.5–8.1)

## 2015-09-03 LAB — URINE MICROSCOPIC-ADD ON

## 2015-09-03 LAB — I-STAT TROPONIN, ED: Troponin i, poc: 0 ng/mL (ref 0.00–0.08)

## 2015-09-03 MED ORDER — ACETAMINOPHEN 500 MG PO TABS
1000.0000 mg | ORAL_TABLET | Freq: Once | ORAL | Status: AC
  Administered 2015-09-03: 1000 mg via ORAL
  Filled 2015-09-03: qty 2

## 2015-09-03 MED ORDER — SODIUM CHLORIDE 0.9 % IV BOLUS (SEPSIS)
1000.0000 mL | Freq: Once | INTRAVENOUS | Status: AC
Start: 2015-09-03 — End: 2015-09-03
  Administered 2015-09-03: 1000 mL via INTRAVENOUS

## 2015-09-03 NOTE — Discharge Instructions (Signed)
Continue ceftinir.   Stop taking prednisone.   Take naprosyn and tylenol for headaches.  See your doctor.   Return to ER if you have worse headaches, vomiting, worse neck swelling.

## 2015-09-03 NOTE — ED Provider Notes (Signed)
CSN: [id number]     Arrival date & time 09/03/15  1242 History   First MD Initiated Contact with Patient 09/03/15 1605     Chief Complaint  Patient presents with  . Facial Pain     (Consider location/radiation/quality/duration/timing/severity/associated sxs/prior Treatment) The history is provided by the patient.  Wanda Rodriguez is a 49 y.o. female here with left sided neck and face pain. She was diagnosed with left otitis media and started with Omnicef, prednisone about week ago. She also picked up some Naprosyn 3 days ago. States that for the last several days she has intermittent left-sided headache as well as left-sided neck pain. Neck pain got worse today. Denies any falls or injuries. Denies any weakness. Denies any chest pain or shortness of breath.    Past Medical History  Diagnosis Date  . Insomnia    Past Surgical History  Procedure Laterality Date  . Abdominal hysterectomy    . Cholecystectomy     No family history on file. Social History  Substance Use Topics  . Smoking status: Never Smoker   . Smokeless tobacco: None  . Alcohol Use: No   OB History    No data available     Review of Systems  Musculoskeletal: Positive for neck pain.  All other systems reviewed and are negative.     Allergies  Penicillins and Sulfa antibiotics  Home Medications   Prior to Admission medications   Medication Sig Start Date End Date Taking? Authorizing Provider  cefdinir (OMNICEF) 300 MG capsule Take 300 mg by mouth every 12 (twelve) hours. Take for 7 days. Started on 08-26-15   Yes Historical Provider, MD  cyclobenzaprine (FLEXERIL) 10 MG tablet Take 10 mg by mouth 3 (three) times daily as needed for muscle spasms.   Yes Historical Provider, MD  diphenhydrAMINE (BENADRYL) 25 MG tablet Take 25 mg by mouth every 4 (four) hours as needed for allergies.    Yes Historical Provider, MD  furosemide (LASIX) 20 MG tablet Take 20 mg by mouth daily as needed for fluid.    Yes Historical  Provider, MD  ibuprofen (ADVIL,MOTRIN) 200 MG tablet Take 200 mg by mouth every 6 (six) hours as needed for moderate pain.   Yes Historical Provider, MD  meclizine (ANTIVERT) 25 MG tablet Take 25 mg by mouth 3 (three) times daily as needed for dizziness.   Yes Historical Provider, MD  Multiple Vitamin (MULTIVITAMIN WITH MINERALS) TABS Take 1 tablet by mouth daily.   Yes Historical Provider, MD  naproxen (EC NAPROSYN) 500 MG EC tablet Take 500 mg by mouth 3 (three) times daily with meals.   Yes Historical Provider, MD  Naproxen Sodium (ALEVE) 220 MG CAPS Take 1 capsule by mouth daily as needed. For pain   Yes Historical Provider, MD  predniSONE (DELTASONE) 10 MG tablet Take 10 mg by mouth 2 (two) times daily. Take for 10 days. Patient started on 08-26-15   Yes Historical Provider, MD  sertraline (ZOLOFT) 50 MG tablet Take 50 mg by mouth at bedtime.   Yes Historical Provider, MD  nystatin (MYCOSTATIN/NYSTOP) 100000 UNIT/GM POWD Apply 1 g topically 2 (two) times daily. Patient not taking: Reported on 09/03/2015 07/20/14   Kathrynn Speed, PA-C  phenazopyridine (PYRIDIUM) 200 MG tablet Take 1 tablet (200 mg total) by mouth 3 (three) times daily as needed for pain (for dysuria). Patient not taking: Reported on 09/03/2015 06/13/14   Hyacinth Meeker, MD  predniSONE (DELTASONE) 10 MG tablet Take this in coordination with  your other dose pack. Patient not taking: Reported on 09/03/2015 07/20/14   Nada Boozer Hess, PA-C   BP 120/82 mmHg  Pulse 69  Temp(Src) 97.4 F (36.3 C) (Oral)  Resp 18  SpO2 100% Physical Exam  Constitutional: She is oriented to person, place, and time. She appears well-developed and well-nourished.  HENT:  Head: Normocephalic.  Right Ear: External ear normal.  Mouth/Throat: Oropharynx is clear and moist.  L TM slightly bulging, not red. No mastoid tenderness.   Eyes: Pupils are equal, round, and reactive to light.  Neck: Normal range of motion.  Mild L cervical LAD. No midline tenderness    Cardiovascular: Normal rate, regular rhythm and normal heart sounds.   Pulmonary/Chest: Effort normal and breath sounds normal. No respiratory distress. She has no wheezes. She has no rales.  Abdominal: Soft. Bowel sounds are normal. She exhibits no distension. There is no tenderness. There is no rebound and no guarding.  Musculoskeletal: Normal range of motion. She exhibits no edema or tenderness.  Neurological: She is alert and oriented to person, place, and time. No cranial nerve deficit. Coordination normal.  CN 2-12 intact. Nl strength throughout   Skin: Skin is warm and dry.  Psychiatric: She has a normal mood and affect. Her behavior is normal. Judgment and thought content normal.  Nursing note and vitals reviewed.   ED Course  Procedures (including critical care time) Labs Review Labs Reviewed  COMPREHENSIVE METABOLIC PANEL - Abnormal; Notable for the following:    Total Protein 6.3 (*)    All other components within normal limits  URINALYSIS, ROUTINE W REFLEX MICROSCOPIC (NOT AT [facility]) - Abnormal; Notable for the following:    Leukocytes, UA TRACE (*)    All other components within normal limits  URINE MICROSCOPIC-ADD ON - Abnormal; Notable for the following:    Squamous Epithelial / LPF FEW (*)    All other components within normal limits  CBC  DIFFERENTIAL  I-STAT TROPOININ, ED    Imaging Review No results found. I have personally reviewed and evaluated these images and lab results as part of my medical decision-making.   EKG Interpretation   Date/Time:  Wednesday September 03 2015 12:58:11 EDT Ventricular Rate:  76 PR Interval:  162 QRS Duration: 84 QT Interval:  388 QTC Calculation: 436 R Axis:   9 Text Interpretation:  Normal sinus rhythm Normal ECG No significant change  since last tracing Confirmed by YAO  MD, DAVID ([id number]) on 09/03/2015  4:05:48 PM      MDM   Final diagnoses:  None    Wanda Rodriguez is a 49 y.o. female  Here with L sided neck  pain. Well appearing, no evidence of mastoiditis and ear infection appears getting treated. Afebrile in the ED. OP clear. WBC nl. I don't know why she was started on omnicef and steroids at the same time. Would dc steroids and recommend finish abx. No signs of stroke, nl neuro exam. Will dc home.   Richardean Canal, MD 09/03/15 [phone]

## 2015-09-03 NOTE — ED Notes (Addendum)
Pt reports to the ED for eval of left face and left head pain that started at 830 this am. She was prescribed Naproxen and Prednisone and has been taking those but she has also been taking Ibuprofen. Denies any LOC or injury. Denies any numbness, tingling, or unilateral weakness. 12 lead obtained en route unremarkable. Grips equal, no arm drift, no facial droop. Recent dx with an ear infection in her left ear. Reports she has pressure behind it and has been on abx as well. Pt A&Ox4, resp e/u, and skin warm and dry.

## 2017-02-08 DIAGNOSIS — R079 Chest pain, unspecified: Secondary | ICD-10-CM | POA: Diagnosis not present

## 2017-02-08 DIAGNOSIS — R0789 Other chest pain: Secondary | ICD-10-CM | POA: Diagnosis not present

## 2017-02-10 DIAGNOSIS — M1731 Unilateral post-traumatic osteoarthritis, right knee: Secondary | ICD-10-CM | POA: Diagnosis not present

## 2017-02-10 DIAGNOSIS — L209 Atopic dermatitis, unspecified: Secondary | ICD-10-CM | POA: Diagnosis not present

## 2017-02-10 DIAGNOSIS — R0789 Other chest pain: Secondary | ICD-10-CM | POA: Diagnosis not present

## 2017-02-23 DIAGNOSIS — M7730 Calcaneal spur, unspecified foot: Secondary | ICD-10-CM | POA: Diagnosis not present

## 2017-02-23 DIAGNOSIS — M722 Plantar fascial fibromatosis: Secondary | ICD-10-CM | POA: Diagnosis not present

## 2017-02-23 DIAGNOSIS — R600 Localized edema: Secondary | ICD-10-CM | POA: Diagnosis not present

## 2017-02-24 DIAGNOSIS — R600 Localized edema: Secondary | ICD-10-CM | POA: Diagnosis not present

## 2017-03-08 DIAGNOSIS — R079 Chest pain, unspecified: Secondary | ICD-10-CM | POA: Diagnosis not present

## 2017-03-08 DIAGNOSIS — R072 Precordial pain: Secondary | ICD-10-CM | POA: Diagnosis not present

## 2017-03-08 DIAGNOSIS — K219 Gastro-esophageal reflux disease without esophagitis: Secondary | ICD-10-CM | POA: Diagnosis not present

## 2017-03-10 DIAGNOSIS — L501 Idiopathic urticaria: Secondary | ICD-10-CM | POA: Diagnosis not present

## 2017-03-10 DIAGNOSIS — M1731 Unilateral post-traumatic osteoarthritis, right knee: Secondary | ICD-10-CM | POA: Diagnosis not present

## 2017-03-10 DIAGNOSIS — R0789 Other chest pain: Secondary | ICD-10-CM | POA: Diagnosis not present

## 2017-03-10 DIAGNOSIS — R600 Localized edema: Secondary | ICD-10-CM | POA: Diagnosis not present

## 2017-03-30 DIAGNOSIS — Z6841 Body Mass Index (BMI) 40.0 and over, adult: Secondary | ICD-10-CM | POA: Diagnosis not present

## 2017-03-30 DIAGNOSIS — R079 Chest pain, unspecified: Secondary | ICD-10-CM | POA: Diagnosis not present

## 2017-04-12 DIAGNOSIS — R079 Chest pain, unspecified: Secondary | ICD-10-CM | POA: Diagnosis not present

## 2017-04-21 DIAGNOSIS — Z6841 Body Mass Index (BMI) 40.0 and over, adult: Secondary | ICD-10-CM | POA: Diagnosis not present

## 2017-04-21 DIAGNOSIS — L209 Atopic dermatitis, unspecified: Secondary | ICD-10-CM | POA: Diagnosis not present

## 2017-04-21 DIAGNOSIS — M1731 Unilateral post-traumatic osteoarthritis, right knee: Secondary | ICD-10-CM | POA: Diagnosis not present

## 2017-05-13 DIAGNOSIS — S20219A Contusion of unspecified front wall of thorax, initial encounter: Secondary | ICD-10-CM | POA: Diagnosis not present

## 2017-05-26 DIAGNOSIS — L2089 Other atopic dermatitis: Secondary | ICD-10-CM | POA: Diagnosis not present

## 2017-05-30 DIAGNOSIS — M722 Plantar fascial fibromatosis: Secondary | ICD-10-CM | POA: Diagnosis not present

## 2017-05-30 DIAGNOSIS — Z6841 Body Mass Index (BMI) 40.0 and over, adult: Secondary | ICD-10-CM | POA: Diagnosis not present

## 2017-05-30 DIAGNOSIS — M7732 Calcaneal spur, left foot: Secondary | ICD-10-CM | POA: Diagnosis not present

## 2017-06-30 ENCOUNTER — Ambulatory Visit (INDEPENDENT_AMBULATORY_CARE_PROVIDER_SITE_OTHER): Payer: BLUE CROSS/BLUE SHIELD | Admitting: Sports Medicine

## 2017-06-30 VITALS — BP 126/64 | HR 61 | Ht 65.0 in | Wt 248.0 lb

## 2017-06-30 DIAGNOSIS — M779 Enthesopathy, unspecified: Secondary | ICD-10-CM | POA: Diagnosis not present

## 2017-06-30 DIAGNOSIS — M79672 Pain in left foot: Secondary | ICD-10-CM

## 2017-06-30 DIAGNOSIS — M729 Fibroblastic disorder, unspecified: Secondary | ICD-10-CM | POA: Diagnosis not present

## 2017-06-30 DIAGNOSIS — M7732 Calcaneal spur, left foot: Secondary | ICD-10-CM

## 2017-06-30 MED ORDER — TRIAMCINOLONE ACETONIDE 10 MG/ML IJ SUSP: 10.0000 mg | Freq: Once | INTRAMUSCULAR | Status: AC

## 2017-06-30 MED ORDER — METHYLPREDNISOLONE 4 MG PO TBPK: ORAL_TABLET | ORAL | 0 refills | Status: DC

## 2017-06-30 NOTE — Progress Notes (Signed)
Subjective: Wanda Rodriguez is a 51 y.o. female patient presents to office with complaint of heel pain on the left>right. 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 Patient has treated this problem with OTC inserts,  Change in shoes, compression sleeve, rest, ice, elevation with no relief. Admits had injection by PCP 1 month ago with no relief. Denies any other pedal complaints. Admits to history of right knee problems as well.   There are no active problems to display for this patient.   No current outpatient prescriptions on file prior to visit.   No current facility-administered medications on file prior to visit.     Allergies  Allergen Reactions  . Penicillins Rash  . Sulfa Antibiotics Rash    Objective: Physical Exam General: The patient is alert and oriented x3 in no acute distress.  Dermatology: Skin is warm, dry and supple bilateral lower extremities. Nails 1-10 are normal. There is no erythema, edema, no eccymosis, no open lesions present. Integument is otherwise unremarkable.  Vascular: Dorsalis Pedis pulse and Posterior Tibial pulse are 2/4 bilateral. Capillary fill time is immediate to all digits.  Neurological: Grossly intact to light touch with an achilles reflex of +2/5 and a  negative Tinel's sign bilateral.  Musculoskeletal: Tenderness to palpation at the medial calcaneal tubercale and through the insertion of the plantar fascia on the left foot. + Pain to 5th met base and dorsal midfoot on left. No pain with compression of calcaneus bilateral. No pain with tuning fork to calcaneus bilateral. No pain with calf compression bilateral. There is decreased Ankle joint range of motion bilateral. All other joints range of motion within normal limits bilateral. Strength 5/5 in all groups bilateral.   Gait: Unassisted, Antalgic avoid weight on left/right heel  Xray,on CD from 02/2017 Normal osseous mineralization. Joint spaces preserved except at midfoot  and ankle where there arthritis. No fracture/dislocation/boney destruction. Posterior and inferior Calcaneal spur present with mild thickening of plantar fascia. + Enthesis at 5th met base. No other soft tissue abnormalities or radiopaque foreign bodies.   Assessment and Plan: Problem List Items Addressed This Visit    None    Visit Diagnoses    Fasciitis    -  Primary   Relevant Medications   triamcinolone acetonide (KENALOG) 10 MG/ML injection 10 mg   methylPREDNISolone (MEDROL DOSEPAK) 4 MG TBPK tablet   Heel spur, left       Left foot pain       Tendonitis          -Complete examination performed.  -Xrays reviewed -Discussed with patient in detail the condition of plantar fasciitis with compensation tendontis, how this occurs and general treatment options. Explained both conservative and surgical treatments.  -After oral consent and aseptic prep, injected a mixture containing 1 ml of 2%  plain lidocaine, 1 ml 0.5% plain marcaine, 0.5 ml of kenalog 10 and 0.5 ml of dexamethasone phosphate into left heel. Post-injection care discussed with patient.  -Rx Medrol dose pack and may continue with Celebrex  -Recommended good supportive shoes and advised use of OTC insert. Explained to patient that if these orthoses work well, we will continue with these. If these do not improve her condition and  pain, we will consider custom molded orthoses. - Explained in detail the use of the fascial brace for the left which was dispensed at today's visit. -Explained and dispensed to patient daily stretching exercises. -Recommend patient to ice affected area 1-2x daily. -Patient to return  to office in 4 weeks for follow up or sooner if problems or questions arise.  Landis Martins, DPM

## 2017-06-30 NOTE — Patient Instructions (Signed)

## 2017-06-30 NOTE — Progress Notes (Signed)
   Subjective:    Patient ID: Wanda Rodriguez, female    DOB: October 11, 1966, 51 y.o.   MRN: [id number]  HPI  Left foot and ankle pain.     Review of Systems  Constitutional: Positive for fatigue.  Musculoskeletal: Positive for back pain and myalgias.  Skin: Positive for rash.  Neurological: Positive for dizziness.  All other systems reviewed and are negative.      Objective:   Physical Exam        Assessment & Plan:

## 2017-07-28 ENCOUNTER — Ambulatory Visit (INDEPENDENT_AMBULATORY_CARE_PROVIDER_SITE_OTHER): Payer: BLUE CROSS/BLUE SHIELD | Admitting: Sports Medicine

## 2017-07-28 ENCOUNTER — Encounter: Payer: Self-pay | Admitting: Sports Medicine

## 2017-07-28 DIAGNOSIS — M79672 Pain in left foot: Secondary | ICD-10-CM

## 2017-07-28 DIAGNOSIS — M7732 Calcaneal spur, left foot: Secondary | ICD-10-CM

## 2017-07-28 DIAGNOSIS — M779 Enthesopathy, unspecified: Secondary | ICD-10-CM

## 2017-07-28 DIAGNOSIS — M729 Fibroblastic disorder, unspecified: Secondary | ICD-10-CM | POA: Diagnosis not present

## 2017-07-28 NOTE — Patient Instructions (Signed)

## 2017-07-29 MED ORDER — TRIAMCINOLONE ACETONIDE 10 MG/ML IJ SUSP: 10.0000 mg | Freq: Once | INTRAMUSCULAR | Status: AC

## 2017-07-29 NOTE — Progress Notes (Signed)
Subjective: Wanda Rodriguez is a 51 y.o. female returns to office for follow up evaluation after Left heel injection for plantar fasciitis, injection #1 administered 4 weeks ago. Patient states that the injection seems to help her pain; pain is now 6/10 and has  decreased in frequency to the area. However does at times feel like it has spread it out over her foot, especially after a 12 hour shift at work. States that she try to wear the brace, but it causes rubbing and was uncomfortable. Patient denies any recent changes in medications or new problems since last visit.   There are no active problems to display for this patient.   Current Outpatient Prescriptions on File Prior to Visit  Medication Sig Dispense Refill  . aspirin 81 MG tablet Take 81 mg by mouth daily.    . celecoxib (CELEBREX) 200 MG capsule Take 200 mg by mouth 2 (two) times daily.    Marland Kitchen levocetirizine (XYZAL) 5 MG tablet Take 5 mg by mouth every evening.    . Lorcaserin HCl (BELVIQ PO) Take 40 mg by mouth.    . methylPREDNISolone (MEDROL DOSEPAK) 4 MG TBPK tablet Take as instructed 21 tablet 0  . Multiple Vitamins-Minerals (MULTIVITAMIN ADULT PO) Take by mouth.    . traMADol (ULTRAM) 50 MG tablet Take by mouth every 6 (six) hours as needed.     Current Facility-Administered Medications on File Prior to Visit  Medication Dose Route Frequency Provider Last Rate Last Dose  . triamcinolone acetonide (KENALOG) 10 MG/ML injection 10 mg  10 mg Other Once Landis Martins, DPM        Allergies  Allergen Reactions  . Penicillins Rash  . Sulfa Antibiotics Rash    Objective:   General:  Alert and oriented x 3, in no acute distress  Dermatology: Skin is warm, dry, and supple bilateral. Nails are within normal limits. There is no lower extremity erythema, no eccymosis, no open lesions present bilateral.   Vascular: Dorsalis Pedis and Posterior Tibial pedal pulses are 2/4 bilateral. + hair growth noted bilateral. Capillary Fill Time  is 3 seconds in all digits. No varicosities, No edema bilateral lower extremities.   Neurological: Sensation grossly intact to light touch with an achilles reflex of +2 and a  negative Tinel's sign bilateral. Vibratory, sharp/dull, Semmes Weinstein Monofilament within normal limits.   Musculoskeletal: There is decreased tenderness to palpation at the medial calcaneal tubercale and through the insertion of the plantar fascia on the Left. Decreased pain to palpation at the fifth met base and dorsal midfoot on left. No pain with compression to calcaneus or application of tuning fork. There is decreased Ankle joint range of motion bilateral. All other jointsrange of motion  within normal limits bilateral. Strength 5/5 bilateral.   Assessment and Plan: Problem List Items Addressed This Visit    None    Visit Diagnoses    Fasciitis    -  Primary   Relevant Medications   triamcinolone acetonide (KENALOG) 10 MG/ML injection 10 mg   Heel spur, left       Relevant Medications   triamcinolone acetonide (KENALOG) 10 MG/ML injection 10 mg   Left foot pain       Relevant Medications   triamcinolone acetonide (KENALOG) 10 MG/ML injection 10 mg   Tendonitis         -Complete examination performed.  -Discussed with patient in detail the condition of plantar fasciitis with tendinitis, how this  occurs related to the foot type of  the patient and general treatment options. - Patient opted for another injection today; After oral consent and aseptic prep, injected a mixture containing 1 ml of 1%plain lidocaine, 1 ml 0.5% plain marcaine, 0.5 ml of kenalog 10 and 0.5 ml of dexmethasone phosphate to left heel at area of most pain/trigger point injection at the medial attachment of the plantar fascia. -Applied plantar fascial strapping/taping with instructions on keeping the taping clean, dry and intact for the next 5 days for optimal benefit -Continue with stretching, icing, good supportive shoes; once her  symptoms are improved. Patient will benefit from custom functional foot orthotics, especially if taping works well. -Discussed long term care and reocurrence; will closely monitor; if fails to improve will consider other treatment modalities.  -Patient to return to office in 2 weeks for follow up or sooner if problems or questions arise. If no improvement, we will consider night splint and a refill on her medications.   Landis Martins, DPM

## 2017-08-11 ENCOUNTER — Ambulatory Visit (INDEPENDENT_AMBULATORY_CARE_PROVIDER_SITE_OTHER): Payer: BLUE CROSS/BLUE SHIELD | Admitting: Sports Medicine

## 2017-08-11 ENCOUNTER — Encounter (INDEPENDENT_AMBULATORY_CARE_PROVIDER_SITE_OTHER): Payer: Self-pay

## 2017-08-11 DIAGNOSIS — M729 Fibroblastic disorder, unspecified: Secondary | ICD-10-CM

## 2017-08-11 DIAGNOSIS — M779 Enthesopathy, unspecified: Secondary | ICD-10-CM

## 2017-08-11 DIAGNOSIS — M7732 Calcaneal spur, left foot: Secondary | ICD-10-CM

## 2017-08-11 DIAGNOSIS — M79672 Pain in left foot: Secondary | ICD-10-CM | POA: Diagnosis not present

## 2017-08-11 MED ORDER — DICLOFENAC SODIUM 1 % TD GEL: TRANSDERMAL | 0 refills | Status: AC

## 2017-08-11 MED ORDER — DICLOFENAC SODIUM 75 MG PO TBEC: 75.0000 mg | DELAYED_RELEASE_TABLET | Freq: Two times a day (BID) | ORAL | 0 refills | Status: AC

## 2017-08-11 MED ORDER — METHYLPREDNISOLONE 4 MG PO TBPK: ORAL_TABLET | ORAL | 0 refills | Status: AC

## 2017-08-11 NOTE — Progress Notes (Signed)
Subjective: Wanda Rodriguez is a 51 y.o. female returns to office for follow up evaluation after Left heel injection for plantar fasciitis, injection #2 administered 4 weeks ago. Patient states that the injection seems to help her pain; pain is now 5/10 and has  decreased in frequency to the area however has a new pain over top of foot, especially after a 12 hour shift at work. States that the taping helped. Patient denies any recent changes in medications or new problems since last visit.   There are no active problems to display for this patient.   Current Outpatient Prescriptions on File Prior to Visit  Medication Sig Dispense Refill  . aspirin 81 MG tablet Take 81 mg by mouth daily.    . celecoxib (CELEBREX) 200 MG capsule Take 200 mg by mouth 2 (two) times daily.    Marland Kitchen. levocetirizine (XYZAL) 5 MG tablet Take 5 mg by mouth every evening.    . Lorcaserin HCl (BELVIQ PO) Take 40 mg by mouth.    . Multiple Vitamins-Minerals (MULTIVITAMIN ADULT PO) Take by mouth.    . traMADol (ULTRAM) 50 MG tablet Take by mouth every 6 (six) hours as needed.     Current Facility-Administered Medications on File Prior to Visit  Medication Dose Route Frequency Provider Last Rate Last Dose  . triamcinolone acetonide (KENALOG) 10 MG/ML injection 10 mg  10 mg Other Once Asencion IslamStover, Justice Milliron, DPM      . triamcinolone acetonide (KENALOG) 10 MG/ML injection 10 mg  10 mg Other Once Asencion IslamStover, Ahriana Gunkel, DPM        Allergies  Allergen Reactions  . Penicillins Rash  . Sulfa Antibiotics Rash    Objective:   General:  Alert and oriented x 3, in no acute distress  Dermatology: Skin is warm, dry, and supple bilateral. Nails are within normal limits. There is no lower extremity erythema, no eccymosis, no open lesions present bilateral.   Vascular: Dorsalis Pedis and Posterior Tibial pedal pulses are 2/4 bilateral. + hair growth noted bilateral. Capillary Fill Time is 3 seconds in all digits. No varicosities, No edema bilateral  lower extremities.   Neurological: Sensation grossly intact to light touch with an achilles reflex of +2 and a  negative Tinel's sign bilateral. Vibratory, sharp/dull, Semmes Weinstein Monofilament within normal limits.   Musculoskeletal: There is decreased tenderness to palpation at the medial calcaneal tubercale and through the insertion of the plantar fascia on the Left. Mild pain dorsal midfoot on left and over extensor tendons. No pain with compression to calcaneus or application of tuning fork. There is decreased Ankle joint range of motion bilateral. All other jointsrange of motion  within normal limits bilateral. Strength 5/5 bilateral.   Assessment and Plan: Problem List Items Addressed This Visit    None    Visit Diagnoses    Fasciitis    -  Primary   Relevant Medications   methylPREDNISolone (MEDROL DOSEPAK) 4 MG TBPK tablet   diclofenac (VOLTAREN) 75 MG EC tablet   diclofenac sodium (VOLTAREN) 1 % GEL   Tendonitis       Relevant Medications   diclofenac (VOLTAREN) 75 MG EC tablet   diclofenac sodium (VOLTAREN) 1 % GEL   Heel spur, left       Relevant Medications   diclofenac (VOLTAREN) 75 MG EC tablet   diclofenac sodium (VOLTAREN) 1 % GEL   Left foot pain       Relevant Medications   diclofenac (VOLTAREN) 75 MG EC tablet   diclofenac  sodium (VOLTAREN) 1 % GEL     -Complete examination performed.  -Discussed with patient in detail the condition of plantar fasciitis with tendinitis, how this  occurs related to the foot type of the patient and general treatment options. - Rx Medrol and Diclofenac -Dispensed night splint  -Continue with stretching, icing, good supportive shoes; once her symptoms are improved. Patient will benefit from custom functional foot orthotics, since the taping worked well. -Discussed long term care and reocurrence; will closely monitor; if fails to improve will consider other treatment modalities.  -Patient to return to office in 3-4 weeks for  follow up or sooner if problems or questions arise.  Asencion Islam, DPM

## 2017-08-15 ENCOUNTER — Telehealth: Payer: Self-pay | Admitting: *Deleted

## 2017-08-15 MED ORDER — NONFORMULARY OR COMPOUNDED ITEM: 2 refills | Status: AC

## 2017-08-15 NOTE — Telephone Encounter (Addendum)
-----   Message from Asencion Islamitorya Stover, [place] sent at 08/11/2017  5:31 PM EDT ----- Regarding: Voltaren gel Voltaren gel requires Prior Auth. Can notify patient to offer patient topical NSAID/pain cream for her fasciitis/tendonitis [facility] Thanks Dr. Marylene LandStover. 08/15/2017-Left message informing pt of Dr. Wynema BirchStover's new orders and gave [facility] [phone] for insurance coverage and delivery information. [facility]

## 2017-08-18 ENCOUNTER — Encounter (HOSPITAL_COMMUNITY): Payer: Self-pay | Admitting: Vascular Surgery

## 2017-08-29 DIAGNOSIS — S61511A Laceration without foreign body of right wrist, initial encounter: Secondary | ICD-10-CM | POA: Diagnosis not present

## 2017-09-08 ENCOUNTER — Ambulatory Visit (INDEPENDENT_AMBULATORY_CARE_PROVIDER_SITE_OTHER): Payer: BLUE CROSS/BLUE SHIELD | Admitting: Sports Medicine

## 2017-09-08 DIAGNOSIS — M779 Enthesopathy, unspecified: Secondary | ICD-10-CM | POA: Diagnosis not present

## 2017-09-08 DIAGNOSIS — M7732 Calcaneal spur, left foot: Secondary | ICD-10-CM

## 2017-09-08 DIAGNOSIS — M729 Fibroblastic disorder, unspecified: Secondary | ICD-10-CM | POA: Diagnosis not present

## 2017-09-08 DIAGNOSIS — M79672 Pain in left foot: Secondary | ICD-10-CM | POA: Diagnosis not present

## 2017-09-08 MED ORDER — TRAMADOL HCL 50 MG PO TABS: 50.0000 mg | ORAL_TABLET | Freq: Three times a day (TID) | ORAL | 0 refills | Status: AC | PRN

## 2017-09-08 NOTE — Progress Notes (Signed)
Subjective: Wanda Rodriguez is a 51 y.o. female returns to office for follow up evaluation after Left heel injection for plantar fasciitis, injection #2 administered 8 weeks ago. Patient states that the injection seems to help her pain; pain is not as bad, still some days 5/10 and has decreased in frequency to the area with still some pain over the top of her foot. States that the cream helps some. Admits to a few episodes where she could not walk due to pain. Patient denies any recent changes in medications or new problems since last visit.   There are no active problems to display for this patient.   Current Outpatient Prescriptions on File Prior to Visit  Medication Sig Dispense Refill  . aspirin 81 MG tablet Take 81 mg by mouth daily.    . cefdinir (OMNICEF) 300 MG capsule Take 300 mg by mouth every 12 (twelve) hours. Take for 7 days. Started on 08-26-15    . celecoxib (CELEBREX) 200 MG capsule Take 200 mg by mouth 2 (two) times daily.    . cyclobenzaprine (FLEXERIL) 10 MG tablet Take 10 mg by mouth 3 (three) times daily as needed for muscle spasms.    . diclofenac (VOLTAREN) 75 MG EC tablet Take 1 tablet (75 mg total) by mouth 2 (two) times daily. 30 tablet 0  . diclofenac sodium (VOLTAREN) 1 % GEL As needed for left foot pain 100 g 0  . diphenhydrAMINE (BENADRYL) 25 MG tablet Take 25 mg by mouth every 4 (four) hours as needed for allergies.     . furosemide (LASIX) 20 MG tablet Take 20 mg by mouth daily as needed for fluid.     Marland Kitchen ibuprofen (ADVIL,MOTRIN) 200 MG tablet Take 200 mg by mouth every 6 (six) hours as needed for moderate pain.    Marland Kitchen levocetirizine (XYZAL) 5 MG tablet Take 5 mg by mouth every evening.    . Lorcaserin HCl (BELVIQ PO) Take 40 mg by mouth.    . meclizine (ANTIVERT) 25 MG tablet Take 25 mg by mouth 3 (three) times daily as needed for dizziness.    . methylPREDNISolone (MEDROL DOSEPAK) 4 MG TBPK tablet Take as instructed 21 tablet 0  . Multiple Vitamin (MULTIVITAMIN WITH  MINERALS) TABS Take 1 tablet by mouth daily.    . Multiple Vitamins-Minerals (MULTIVITAMIN ADULT PO) Take by mouth.    . naproxen (EC NAPROSYN) 500 MG EC tablet Take 500 mg by mouth 3 (three) times daily with meals.    . Naproxen Sodium (ALEVE) 220 MG CAPS Take 1 capsule by mouth daily as needed. For pain    . NONFORMULARY OR COMPOUNDED ITEM [facility]:  Antiinflammatory Cream - diclofenac 3%, Baclofen 2%, Lidocaine 2%, apply 1-2 grams to affected area 3-4 times a day. 120 each 2  . nystatin (MYCOSTATIN/NYSTOP) 100000 UNIT/GM POWD Apply 1 g topically 2 (two) times daily. (Patient not taking: Reported on 09/03/2015) 30 g 0  . phenazopyridine (PYRIDIUM) 200 MG tablet Take 1 tablet (200 mg total) by mouth 3 (three) times daily as needed for pain (for dysuria). (Patient not taking: Reported on 09/03/2015) 21 tablet 0  . predniSONE (DELTASONE) 10 MG tablet Take this in coordination with your other dose pack. (Patient not taking: Reported on 09/03/2015) 21 tablet 0  . predniSONE (DELTASONE) 10 MG tablet Take 10 mg by mouth 2 (two) times daily. Take for 10 days. Patient started on 08-26-15    . sertraline (ZOLOFT) 50 MG tablet Take 50 mg by mouth  at bedtime.     Current Facility-Administered Medications on File Prior to Visit  Medication Dose Route Frequency Provider Last Rate Last Dose  . triamcinolone acetonide (KENALOG) 10 MG/ML injection 10 mg  10 mg Other Once Asencion Islam, DPM      . triamcinolone acetonide (KENALOG) 10 MG/ML injection 10 mg  10 mg Other Once Asencion Islam, DPM        Allergies  Allergen Reactions  . Penicillins Hives and Nausea And Vomiting    Has patient had a PCN reaction causing immediate rash, facial/tongue/throat swelling, SOB or lightheadedness with hypotension: {Yes Has patient had a PCN reaction causing severe rash involving mucus membranes or skin necrosis: /No Has patient had a PCN reaction that required hospitalization {/No Has patient had a PCN reaction  occurring within the last 10 years:NO If all of the above answers are "NO", then may proceed with Cephalosporin use.   . Sulfa Antibiotics Swelling  . Penicillins Rash  . Sulfa Antibiotics Rash    Objective:   General:  Alert and oriented x 3, in no acute distress  Dermatology: Skin is warm, dry, and supple bilateral. Nails are within normal limits. There is no lower extremity erythema, no eccymosis, no open lesions present bilateral.   Vascular: Dorsalis Pedis and Posterior Tibial pedal pulses are 2/4 bilateral. + hair growth noted bilateral. Capillary Fill Time is 3 seconds in all digits. No varicosities, No edema bilateral lower extremities.   Neurological: Sensation grossly intact to light touch with an achilles reflex of +2 and a  negative Tinel's sign bilateral. Vibratory, sharp/dull, Semmes Weinstein Monofilament within normal limits.   Musculoskeletal: There is decreased tenderness to palpation at the medial calcaneal tubercale and through the insertion of the plantar fascia on the Left. Mild pain dorsal midfoot on left and over extensor tendons, especially tibialis anterior. No pain with compression to calcaneus or application of tuning fork. There is decreased Ankle joint range of motion bilateral. All other joints range of motion  within normal limits bilateral. Strength 5/5 bilateral.   Assessment and Plan: Problem List Items Addressed This Visit    None    Visit Diagnoses    Fasciitis    -  Primary   Relevant Medications   traMADol (ULTRAM) 50 MG tablet   Tendonitis       Relevant Medications   traMADol (ULTRAM) 50 MG tablet   Heel spur, left       Relevant Medications   traMADol (ULTRAM) 50 MG tablet   Left foot pain       Relevant Medications   traMADol (ULTRAM) 50 MG tablet     -Complete examination performed.  -Re-Discussed with patient in detail the condition of plantar fasciitis with tendinitis, how this  occurs related to the foot type of the patient and  general treatment options. -Applied plantar fascial strapping to left foot -Continue with night splint  -Continue with stretching, icing, good supportive shoes; once her symptoms are improved. Will check orthotic coverage and proceed with casting if patient desires.  -Rx Tramadol for severe pain only -Discussed long term care and reocurrence; will closely monitor; if fails to improve will consider other treatment modalities.  -Patient to return to office for follow up/casting or sooner if problems or questions arise.  Asencion Islam, DPM

## 2017-09-08 NOTE — Patient Instructions (Signed)

## 2017-11-09 DIAGNOSIS — H01111 Allergic dermatitis of right upper eyelid: Secondary | ICD-10-CM | POA: Diagnosis not present

## 2017-11-09 DIAGNOSIS — B9689 Other specified bacterial agents as the cause of diseases classified elsewhere: Secondary | ICD-10-CM | POA: Diagnosis not present

## 2017-11-09 DIAGNOSIS — J019 Acute sinusitis, unspecified: Secondary | ICD-10-CM | POA: Diagnosis not present

## 2017-11-09 DIAGNOSIS — M1731 Unilateral post-traumatic osteoarthritis, right knee: Secondary | ICD-10-CM | POA: Diagnosis not present

## 2017-11-09 DIAGNOSIS — H01114 Allergic dermatitis of left upper eyelid: Secondary | ICD-10-CM | POA: Diagnosis not present

## 2017-12-06 DIAGNOSIS — J45909 Unspecified asthma, uncomplicated: Secondary | ICD-10-CM | POA: Diagnosis not present

## 2017-12-06 DIAGNOSIS — R062 Wheezing: Secondary | ICD-10-CM | POA: Diagnosis not present

## 2017-12-26 DIAGNOSIS — L84 Corns and callosities: Secondary | ICD-10-CM | POA: Diagnosis not present

## 2017-12-26 DIAGNOSIS — R21 Rash and other nonspecific skin eruption: Secondary | ICD-10-CM | POA: Diagnosis not present

## 2017-12-26 DIAGNOSIS — Z6841 Body Mass Index (BMI) 40.0 and over, adult: Secondary | ICD-10-CM | POA: Diagnosis not present

## 2017-12-31 DIAGNOSIS — M79604 Pain in right leg: Secondary | ICD-10-CM | POA: Diagnosis not present

## 2018-01-02 DIAGNOSIS — M79661 Pain in right lower leg: Secondary | ICD-10-CM | POA: Diagnosis not present

## 2018-01-02 DIAGNOSIS — M79604 Pain in right leg: Secondary | ICD-10-CM | POA: Diagnosis not present

## 2018-01-02 DIAGNOSIS — M7989 Other specified soft tissue disorders: Secondary | ICD-10-CM | POA: Diagnosis not present

## 2018-01-04 DIAGNOSIS — M79604 Pain in right leg: Secondary | ICD-10-CM | POA: Diagnosis not present

## 2018-01-04 DIAGNOSIS — Z6841 Body Mass Index (BMI) 40.0 and over, adult: Secondary | ICD-10-CM | POA: Diagnosis not present

## 2018-01-12 DIAGNOSIS — S76311A Strain of muscle, fascia and tendon of the posterior muscle group at thigh level, right thigh, initial encounter: Secondary | ICD-10-CM | POA: Diagnosis not present

## 2018-01-12 DIAGNOSIS — M25561 Pain in right knee: Secondary | ICD-10-CM | POA: Diagnosis not present

## 2018-03-02 DIAGNOSIS — S39012A Strain of muscle, fascia and tendon of lower back, initial encounter: Secondary | ICD-10-CM | POA: Diagnosis not present

## 2018-03-02 DIAGNOSIS — R109 Unspecified abdominal pain: Secondary | ICD-10-CM | POA: Diagnosis not present

## 2018-03-02 DIAGNOSIS — X58XXXA Exposure to other specified factors, initial encounter: Secondary | ICD-10-CM | POA: Diagnosis not present

## 2018-03-02 DIAGNOSIS — S335XXA Sprain of ligaments of lumbar spine, initial encounter: Secondary | ICD-10-CM | POA: Diagnosis not present

## 2018-04-13 DIAGNOSIS — H66009 Acute suppurative otitis media without spontaneous rupture of ear drum, unspecified ear: Secondary | ICD-10-CM | POA: Diagnosis not present

## 2018-04-13 DIAGNOSIS — J019 Acute sinusitis, unspecified: Secondary | ICD-10-CM | POA: Diagnosis not present

## 2018-04-13 DIAGNOSIS — J309 Allergic rhinitis, unspecified: Secondary | ICD-10-CM | POA: Diagnosis not present

## 2018-04-26 DIAGNOSIS — Z6841 Body Mass Index (BMI) 40.0 and over, adult: Secondary | ICD-10-CM | POA: Diagnosis not present

## 2018-04-26 DIAGNOSIS — J329 Chronic sinusitis, unspecified: Secondary | ICD-10-CM | POA: Diagnosis not present

## 2018-05-16 DIAGNOSIS — R079 Chest pain, unspecified: Secondary | ICD-10-CM | POA: Diagnosis not present

## 2018-05-16 DIAGNOSIS — R0789 Other chest pain: Secondary | ICD-10-CM | POA: Diagnosis not present

## 2018-05-16 DIAGNOSIS — R0602 Shortness of breath: Secondary | ICD-10-CM | POA: Diagnosis not present

## 2018-05-16 DIAGNOSIS — K219 Gastro-esophageal reflux disease without esophagitis: Secondary | ICD-10-CM | POA: Diagnosis not present

## 2018-05-16 DIAGNOSIS — M1990 Unspecified osteoarthritis, unspecified site: Secondary | ICD-10-CM | POA: Diagnosis not present

## 2018-05-24 DIAGNOSIS — Z1211 Encounter for screening for malignant neoplasm of colon: Secondary | ICD-10-CM | POA: Diagnosis not present

## 2018-05-24 DIAGNOSIS — R5383 Other fatigue: Secondary | ICD-10-CM | POA: Diagnosis not present

## 2018-05-24 DIAGNOSIS — Z0000 Encounter for general adult medical examination without abnormal findings: Secondary | ICD-10-CM | POA: Diagnosis not present

## 2018-05-24 DIAGNOSIS — M1731 Unilateral post-traumatic osteoarthritis, right knee: Secondary | ICD-10-CM | POA: Diagnosis not present

## 2018-05-24 DIAGNOSIS — Z6839 Body mass index (BMI) 39.0-39.9, adult: Secondary | ICD-10-CM | POA: Diagnosis not present

## 2018-05-29 DIAGNOSIS — Z1231 Encounter for screening mammogram for malignant neoplasm of breast: Secondary | ICD-10-CM | POA: Diagnosis not present

## 2018-09-04 DIAGNOSIS — R1310 Dysphagia, unspecified: Secondary | ICD-10-CM | POA: Diagnosis not present

## 2018-09-04 DIAGNOSIS — M1731 Unilateral post-traumatic osteoarthritis, right knee: Secondary | ICD-10-CM | POA: Diagnosis not present

## 2018-09-04 DIAGNOSIS — K219 Gastro-esophageal reflux disease without esophagitis: Secondary | ICD-10-CM | POA: Diagnosis not present

## 2018-09-04 DIAGNOSIS — Z6838 Body mass index (BMI) 38.0-38.9, adult: Secondary | ICD-10-CM | POA: Diagnosis not present

## 2018-09-05 DIAGNOSIS — J189 Pneumonia, unspecified organism: Secondary | ICD-10-CM | POA: Diagnosis not present

## 2018-09-05 DIAGNOSIS — A088 Other specified intestinal infections: Secondary | ICD-10-CM | POA: Diagnosis not present

## 2018-09-05 DIAGNOSIS — R112 Nausea with vomiting, unspecified: Secondary | ICD-10-CM | POA: Diagnosis not present

## 2018-09-12 DIAGNOSIS — K219 Gastro-esophageal reflux disease without esophagitis: Secondary | ICD-10-CM | POA: Diagnosis not present

## 2018-09-12 DIAGNOSIS — R1310 Dysphagia, unspecified: Secondary | ICD-10-CM | POA: Diagnosis not present

## 2018-09-12 DIAGNOSIS — Z1211 Encounter for screening for malignant neoplasm of colon: Secondary | ICD-10-CM | POA: Diagnosis not present

## 2018-09-12 DIAGNOSIS — K5904 Chronic idiopathic constipation: Secondary | ICD-10-CM | POA: Diagnosis not present

## 2018-09-18 DIAGNOSIS — M25561 Pain in right knee: Secondary | ICD-10-CM | POA: Diagnosis not present

## 2018-09-18 DIAGNOSIS — M1711 Unilateral primary osteoarthritis, right knee: Secondary | ICD-10-CM | POA: Diagnosis not present

## 2018-09-18 DIAGNOSIS — G8929 Other chronic pain: Secondary | ICD-10-CM | POA: Diagnosis not present

## 2018-09-21 DIAGNOSIS — Z6838 Body mass index (BMI) 38.0-38.9, adult: Secondary | ICD-10-CM | POA: Diagnosis not present

## 2018-09-21 DIAGNOSIS — Z23 Encounter for immunization: Secondary | ICD-10-CM | POA: Diagnosis not present

## 2018-09-21 DIAGNOSIS — M1731 Unilateral post-traumatic osteoarthritis, right knee: Secondary | ICD-10-CM | POA: Diagnosis not present

## 2018-09-26 DIAGNOSIS — Z01810 Encounter for preprocedural cardiovascular examination: Secondary | ICD-10-CM | POA: Diagnosis not present

## 2018-09-26 DIAGNOSIS — Z01818 Encounter for other preprocedural examination: Secondary | ICD-10-CM | POA: Diagnosis not present

## 2018-09-26 DIAGNOSIS — M1711 Unilateral primary osteoarthritis, right knee: Secondary | ICD-10-CM | POA: Diagnosis not present

## 2018-10-06 ENCOUNTER — Other Ambulatory Visit: Payer: Self-pay | Admitting: Gastroenterology

## 2018-10-06 DIAGNOSIS — Z1211 Encounter for screening for malignant neoplasm of colon: Secondary | ICD-10-CM | POA: Diagnosis not present

## 2018-10-06 DIAGNOSIS — K210 Gastro-esophageal reflux disease with esophagitis: Secondary | ICD-10-CM | POA: Diagnosis not present

## 2018-10-06 DIAGNOSIS — R1310 Dysphagia, unspecified: Secondary | ICD-10-CM | POA: Diagnosis not present

## 2018-10-06 DIAGNOSIS — K6389 Other specified diseases of intestine: Secondary | ICD-10-CM | POA: Diagnosis not present

## 2018-10-06 DIAGNOSIS — K635 Polyp of colon: Secondary | ICD-10-CM | POA: Diagnosis not present

## 2018-10-06 DIAGNOSIS — R933 Abnormal findings on diagnostic imaging of other parts of digestive tract: Secondary | ICD-10-CM

## 2018-10-10 ENCOUNTER — Ambulatory Visit
Admission: RE | Admit: 2018-10-10 | Discharge: 2018-10-10 | Disposition: A | Discharge status: Home / Self Care | Payer: BLUE CROSS/BLUE SHIELD | Source: Ambulatory Visit | Attending: Gastroenterology | Admitting: Gastroenterology

## 2018-10-10 DIAGNOSIS — R933 Abnormal findings on diagnostic imaging of other parts of digestive tract: Secondary | ICD-10-CM

## 2018-10-10 DIAGNOSIS — K5730 Diverticulosis of large intestine without perforation or abscess without bleeding: Secondary | ICD-10-CM | POA: Diagnosis not present

## 2018-10-10 DIAGNOSIS — K7689 Other specified diseases of liver: Secondary | ICD-10-CM | POA: Diagnosis not present

## 2018-10-10 MED ORDER — IOPAMIDOL (ISOVUE-300) INJECTION 61%
100.0000 mL | Freq: Once | INTRAVENOUS | Status: AC | PRN
  Administered 2018-10-10: 100 mL via INTRAVENOUS

## 2018-10-11 DIAGNOSIS — G8918 Other acute postprocedural pain: Secondary | ICD-10-CM | POA: Diagnosis not present

## 2018-10-11 DIAGNOSIS — Z8719 Personal history of other diseases of the digestive system: Secondary | ICD-10-CM | POA: Diagnosis not present

## 2018-10-11 DIAGNOSIS — Z79899 Other long term (current) drug therapy: Secondary | ICD-10-CM | POA: Diagnosis not present

## 2018-10-11 DIAGNOSIS — R0602 Shortness of breath: Secondary | ICD-10-CM | POA: Diagnosis not present

## 2018-10-11 DIAGNOSIS — Z6841 Body Mass Index (BMI) 40.0 and over, adult: Secondary | ICD-10-CM | POA: Diagnosis not present

## 2018-10-11 DIAGNOSIS — M1711 Unilateral primary osteoarthritis, right knee: Secondary | ICD-10-CM | POA: Diagnosis not present

## 2018-10-11 DIAGNOSIS — K219 Gastro-esophageal reflux disease without esophagitis: Secondary | ICD-10-CM | POA: Diagnosis not present

## 2018-10-12 DIAGNOSIS — Z79899 Other long term (current) drug therapy: Secondary | ICD-10-CM | POA: Diagnosis not present

## 2018-10-12 DIAGNOSIS — K219 Gastro-esophageal reflux disease without esophagitis: Secondary | ICD-10-CM | POA: Diagnosis not present

## 2018-10-12 DIAGNOSIS — R0602 Shortness of breath: Secondary | ICD-10-CM | POA: Diagnosis not present

## 2018-10-12 DIAGNOSIS — M1711 Unilateral primary osteoarthritis, right knee: Secondary | ICD-10-CM | POA: Diagnosis not present

## 2018-10-12 DIAGNOSIS — Z8719 Personal history of other diseases of the digestive system: Secondary | ICD-10-CM | POA: Diagnosis not present

## 2018-10-12 DIAGNOSIS — Z6841 Body Mass Index (BMI) 40.0 and over, adult: Secondary | ICD-10-CM | POA: Diagnosis not present

## 2018-10-23 DIAGNOSIS — Z96651 Presence of right artificial knee joint: Secondary | ICD-10-CM | POA: Diagnosis not present

## 2018-10-23 DIAGNOSIS — Z471 Aftercare following joint replacement surgery: Secondary | ICD-10-CM | POA: Diagnosis not present

## 2018-11-06 DIAGNOSIS — Z96651 Presence of right artificial knee joint: Secondary | ICD-10-CM | POA: Diagnosis not present

## 2018-11-06 DIAGNOSIS — Z471 Aftercare following joint replacement surgery: Secondary | ICD-10-CM | POA: Diagnosis not present

## 2018-12-24 IMAGING — CT CT ABD-PELV W/ CM
1 of 3 series · 13 of 32 positions shown, 19 images · IV contrast (APPLIED)
Comparison: Noncontrast CT on 03/02/2018 from [facility]

CLINICAL DATA: Abdominal pain and nausea. Possible appendiceal mass
on recent colonoscopy.

EXAM:
CT ABDOMEN AND PELVIS WITH CONTRAST
TECHNIQUE: Multidetector CT imaging of the abdomen and pelvis was performed
using the standard protocol following bolus administration of
intravenous contrast.
CONTRAST:  100mL KCDUNR-N33 IOPAMIDOL (KCDUNR-N33) INJECTION 61%

[Series 2: abd/pelvis w/cm · axial · 0.89mm/px · z∈[-424,-29]mm · 13 of 93 slices shown, 19 images]
[im 7/93  soft-tissue]
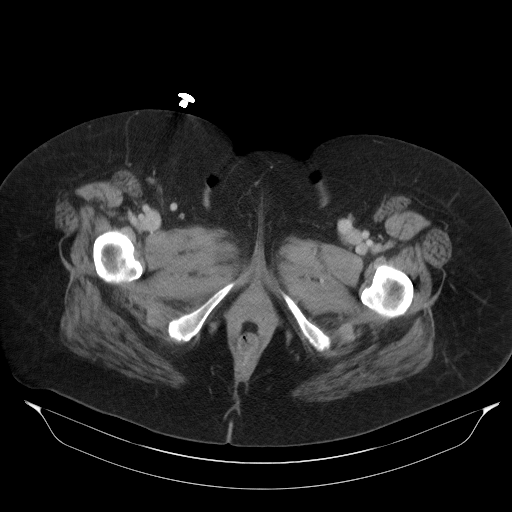
[im 7/93  bone]
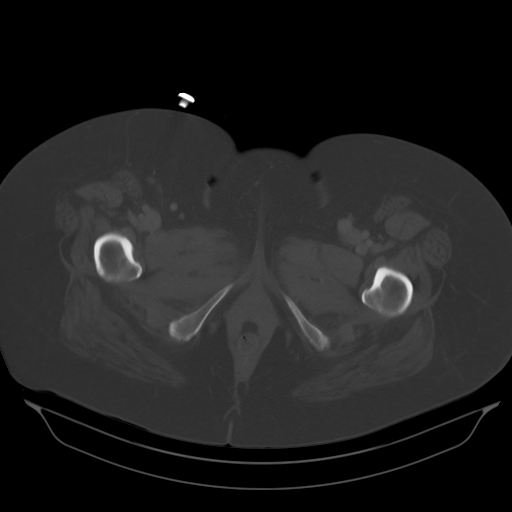
[im 13/93  soft-tissue]
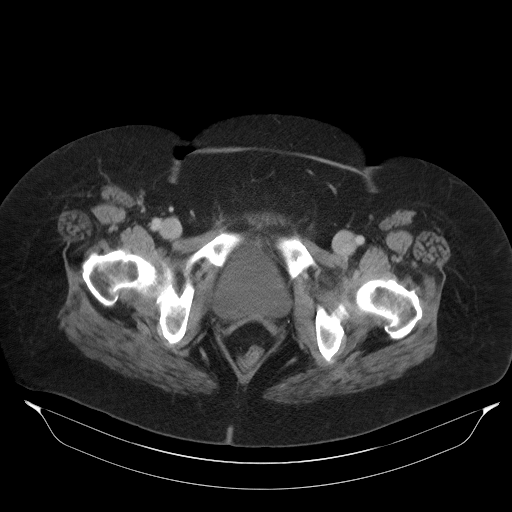
[im 19/93  soft-tissue]
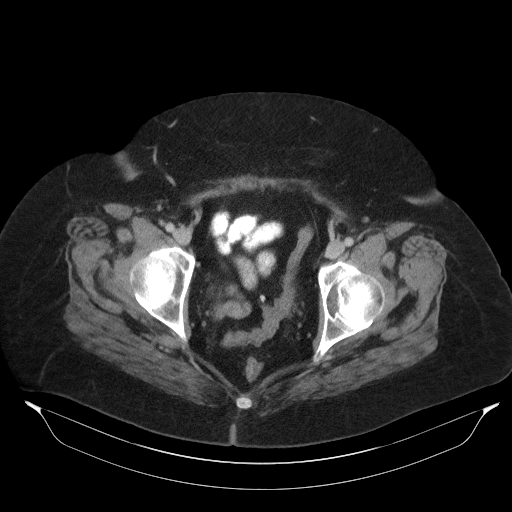
[im 25/93  soft-tissue]
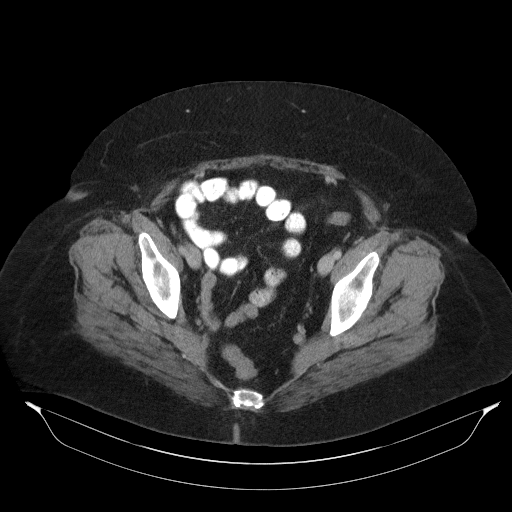
[im 31/93  soft-tissue]
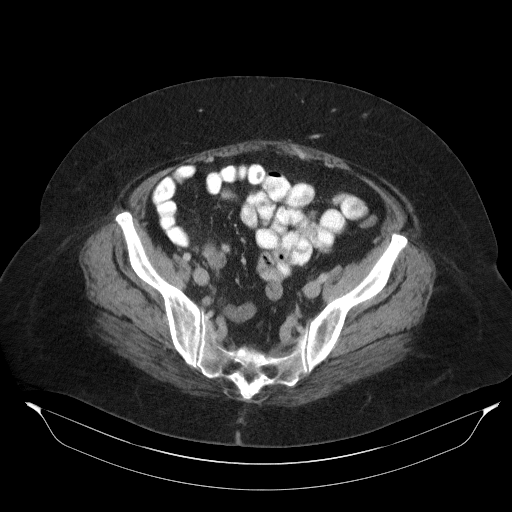
[im 37/93  soft-tissue]
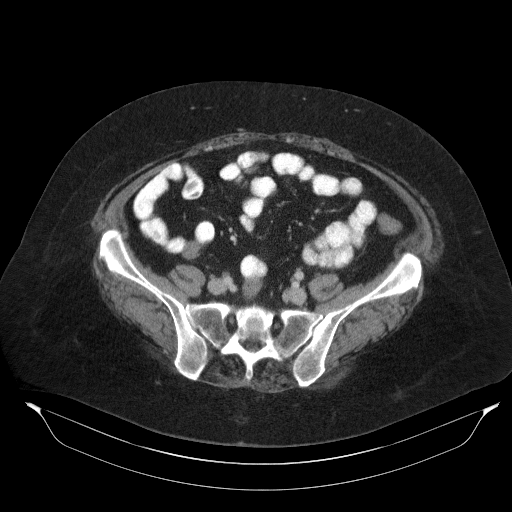
[im 50/93  soft-tissue]
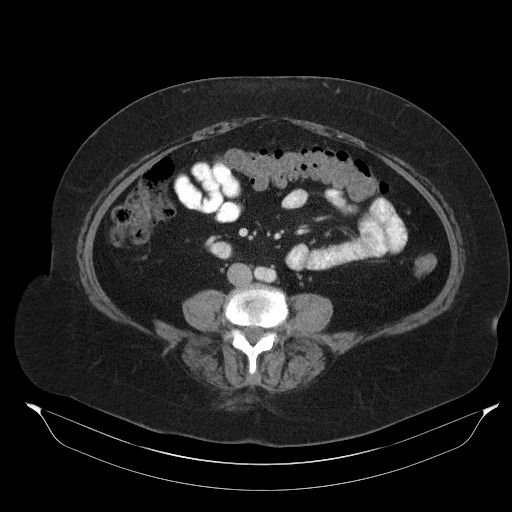
[im 56/93  soft-tissue]
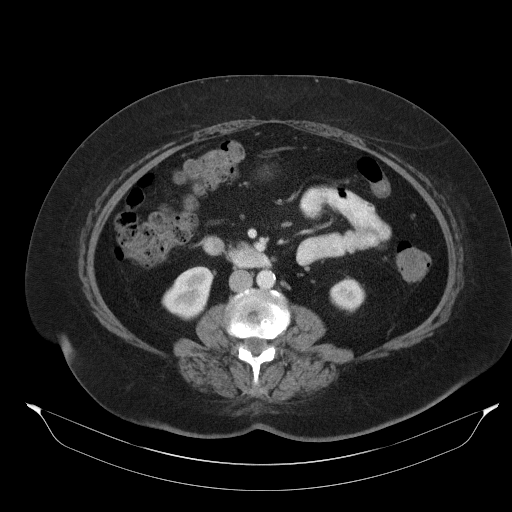
[im 62/93  soft-tissue]
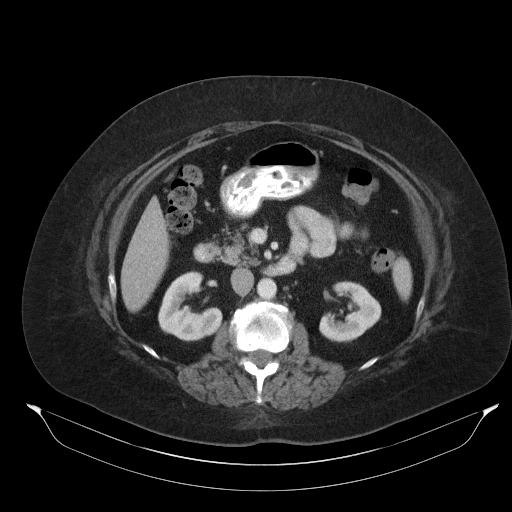
[im 62/93  bone]
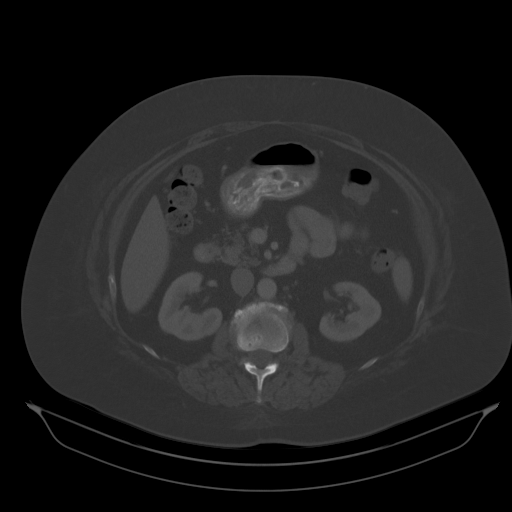
[im 68/93  soft-tissue]
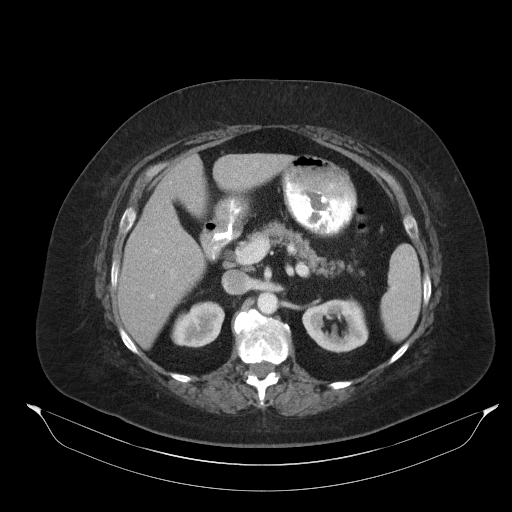
[im 68/93  lung]
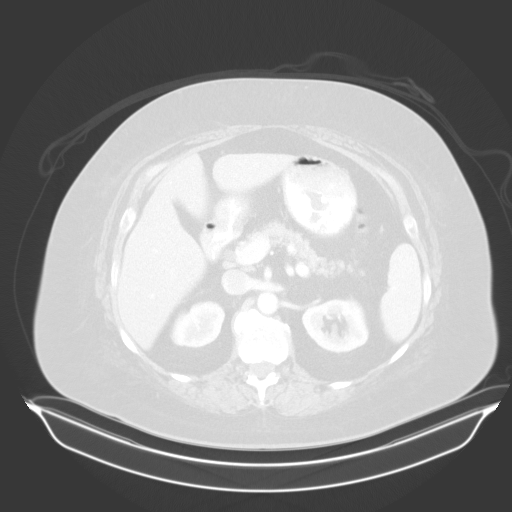
[im 74/93  soft-tissue]
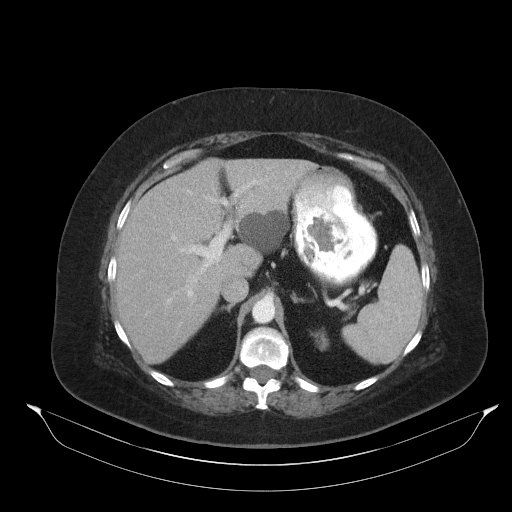
[im 74/93  lung]
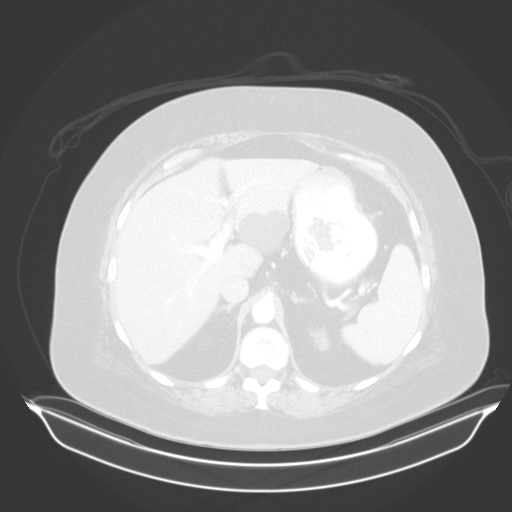
[im 80/93  soft-tissue]
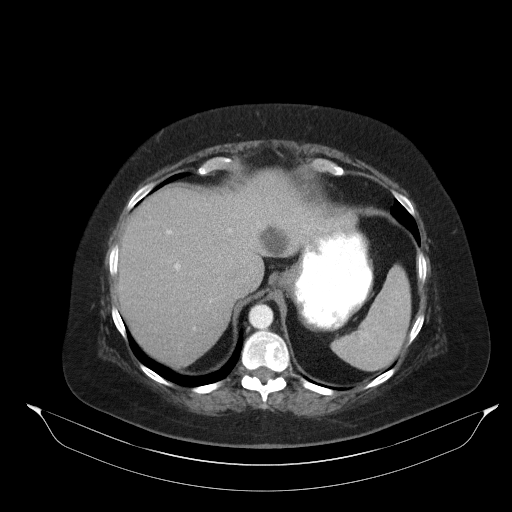
[im 80/93  lung]
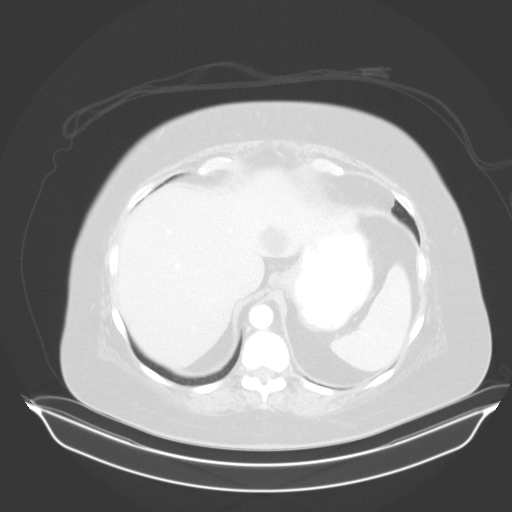
[im 86/93  soft-tissue]
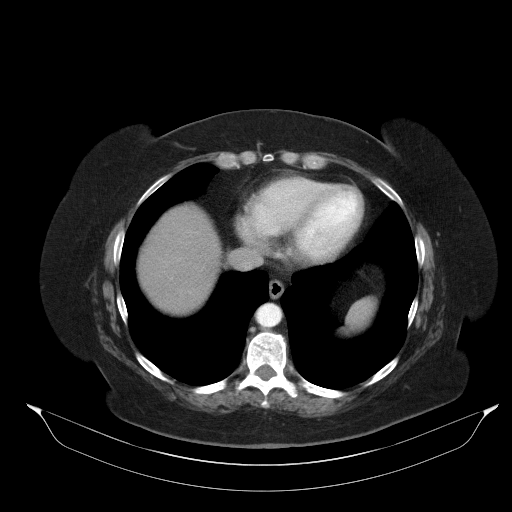
[im 86/93  lung]
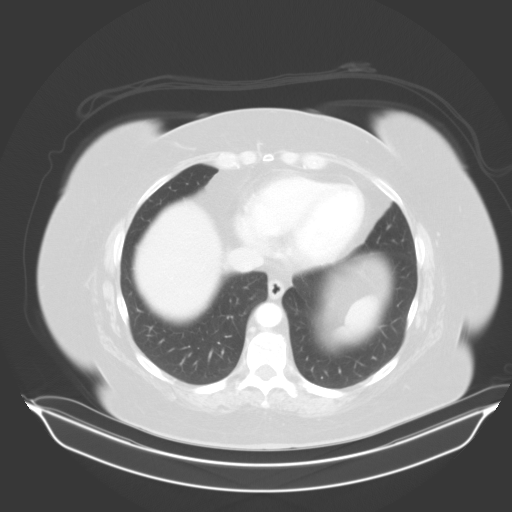

[13 of 32 positions shown; findings below may reference images not displayed]

FINDINGS: Lower Chest: No acute findings.

Hepatobiliary: No hepatic masses identified. Stable cyst in left
hepatic lobe measuring 4.8 cm. Prior cholecystectomy. No evidence of
biliary obstruction.

Pancreas:  No mass or inflammatory changes.

Spleen: Within normal limits in size and appearance.

Adrenals/Urinary Tract: No masses identified. No evidence of
hydronephrosis.

Stomach/Bowel: No evidence of obstruction, inflammatory process or
abnormal fluid collections. A calcified sigmoid diverticulum again
noted. No evidence of diverticulosis. Normal appearance of appendix.
No evidence of appendiceal mass, fluid collection, or appendicitis.

Vascular/Lymphatic: No pathologically enlarged lymph nodes. No
abdominal aortic aneurysm.

Reproductive: Prior hysterectomy noted. Adnexal regions are
unremarkable in appearance.

Other:  None.

Musculoskeletal:  No suspicious bone lesions identified.
IMPRESSION: Normal appearance of appendix. No evidence of appendiceal mass,
mucocele, or other significant abnormality.

Minimal sigmoid diverticulosis.  No evidence of diverticulitis.

## 2019-01-15 DIAGNOSIS — J324 Chronic pansinusitis: Secondary | ICD-10-CM | POA: Diagnosis not present

## 2019-01-15 DIAGNOSIS — R51 Headache: Secondary | ICD-10-CM | POA: Diagnosis not present

## 2019-01-15 DIAGNOSIS — Z6839 Body mass index (BMI) 39.0-39.9, adult: Secondary | ICD-10-CM | POA: Diagnosis not present

## 2019-01-15 DIAGNOSIS — R509 Fever, unspecified: Secondary | ICD-10-CM | POA: Diagnosis not present

## 2019-01-15 DIAGNOSIS — S63609A Unspecified sprain of unspecified thumb, initial encounter: Secondary | ICD-10-CM | POA: Diagnosis not present

## 2019-01-15 DIAGNOSIS — W19XXXA Unspecified fall, initial encounter: Secondary | ICD-10-CM | POA: Diagnosis not present

## 2019-01-17 DIAGNOSIS — S62525A Nondisplaced fracture of distal phalanx of left thumb, initial encounter for closed fracture: Secondary | ICD-10-CM | POA: Diagnosis not present

## 2019-01-25 DIAGNOSIS — G479 Sleep disorder, unspecified: Secondary | ICD-10-CM | POA: Diagnosis not present

## 2019-01-25 DIAGNOSIS — M79604 Pain in right leg: Secondary | ICD-10-CM | POA: Diagnosis not present

## 2019-01-25 DIAGNOSIS — Z6839 Body mass index (BMI) 39.0-39.9, adult: Secondary | ICD-10-CM | POA: Diagnosis not present

## 2019-01-25 DIAGNOSIS — Z1331 Encounter for screening for depression: Secondary | ICD-10-CM | POA: Diagnosis not present

## 2019-02-13 DIAGNOSIS — R0602 Shortness of breath: Secondary | ICD-10-CM | POA: Diagnosis not present

## 2019-02-13 DIAGNOSIS — G4733 Obstructive sleep apnea (adult) (pediatric): Secondary | ICD-10-CM | POA: Diagnosis not present

## 2019-02-14 DIAGNOSIS — R0602 Shortness of breath: Secondary | ICD-10-CM | POA: Diagnosis not present

## 2019-02-14 DIAGNOSIS — G4733 Obstructive sleep apnea (adult) (pediatric): Secondary | ICD-10-CM | POA: Diagnosis not present

## 2019-02-19 DIAGNOSIS — S62525D Nondisplaced fracture of distal phalanx of left thumb, subsequent encounter for fracture with routine healing: Secondary | ICD-10-CM | POA: Diagnosis not present

## 2019-03-06 DIAGNOSIS — G4733 Obstructive sleep apnea (adult) (pediatric): Secondary | ICD-10-CM | POA: Diagnosis not present

## 2019-03-06 DIAGNOSIS — G4719 Other hypersomnia: Secondary | ICD-10-CM | POA: Diagnosis not present

## 2019-03-08 DIAGNOSIS — Z6839 Body mass index (BMI) 39.0-39.9, adult: Secondary | ICD-10-CM | POA: Diagnosis not present

## 2019-03-08 DIAGNOSIS — J309 Allergic rhinitis, unspecified: Secondary | ICD-10-CM | POA: Diagnosis not present

## 2019-04-05 DIAGNOSIS — G4733 Obstructive sleep apnea (adult) (pediatric): Secondary | ICD-10-CM | POA: Diagnosis not present

## 2019-04-05 DIAGNOSIS — G4719 Other hypersomnia: Secondary | ICD-10-CM | POA: Diagnosis not present

## 2019-05-06 DIAGNOSIS — G4733 Obstructive sleep apnea (adult) (pediatric): Secondary | ICD-10-CM | POA: Diagnosis not present

## 2019-05-06 DIAGNOSIS — G4719 Other hypersomnia: Secondary | ICD-10-CM | POA: Diagnosis not present

## 2019-06-05 DIAGNOSIS — G4733 Obstructive sleep apnea (adult) (pediatric): Secondary | ICD-10-CM | POA: Diagnosis not present

## 2019-06-05 DIAGNOSIS — G4719 Other hypersomnia: Secondary | ICD-10-CM | POA: Diagnosis not present

## 2019-07-06 DIAGNOSIS — G4733 Obstructive sleep apnea (adult) (pediatric): Secondary | ICD-10-CM | POA: Diagnosis not present

## 2019-07-06 DIAGNOSIS — G4719 Other hypersomnia: Secondary | ICD-10-CM | POA: Diagnosis not present

## 2019-08-06 DIAGNOSIS — G4719 Other hypersomnia: Secondary | ICD-10-CM | POA: Diagnosis not present

## 2019-08-06 DIAGNOSIS — G4733 Obstructive sleep apnea (adult) (pediatric): Secondary | ICD-10-CM | POA: Diagnosis not present

## 2019-09-03 DIAGNOSIS — J309 Allergic rhinitis, unspecified: Secondary | ICD-10-CM | POA: Diagnosis not present

## 2019-09-06 DIAGNOSIS — G4733 Obstructive sleep apnea (adult) (pediatric): Secondary | ICD-10-CM | POA: Diagnosis not present

## 2019-12-04 DIAGNOSIS — U071 COVID-19: Secondary | ICD-10-CM | POA: Diagnosis not present

## 2019-12-04 DIAGNOSIS — G4733 Obstructive sleep apnea (adult) (pediatric): Secondary | ICD-10-CM | POA: Diagnosis not present

## 2019-12-04 DIAGNOSIS — J989 Respiratory disorder, unspecified: Secondary | ICD-10-CM | POA: Diagnosis not present

## 2019-12-09 DIAGNOSIS — R05 Cough: Secondary | ICD-10-CM | POA: Diagnosis not present

## 2019-12-09 DIAGNOSIS — R0981 Nasal congestion: Secondary | ICD-10-CM | POA: Diagnosis not present

## 2019-12-09 DIAGNOSIS — R0602 Shortness of breath: Secondary | ICD-10-CM | POA: Diagnosis not present

## 2019-12-09 DIAGNOSIS — U071 COVID-19: Secondary | ICD-10-CM | POA: Diagnosis not present

## 2019-12-18 DIAGNOSIS — J40 Bronchitis, not specified as acute or chronic: Secondary | ICD-10-CM | POA: Diagnosis not present

## 2020-01-01 DIAGNOSIS — R0602 Shortness of breath: Secondary | ICD-10-CM | POA: Diagnosis not present

## 2020-03-23 DIAGNOSIS — R21 Rash and other nonspecific skin eruption: Secondary | ICD-10-CM | POA: Diagnosis not present

## 2020-06-29 DIAGNOSIS — R0602 Shortness of breath: Secondary | ICD-10-CM | POA: Diagnosis not present

## 2020-06-29 DIAGNOSIS — I10 Essential (primary) hypertension: Secondary | ICD-10-CM | POA: Diagnosis not present

## 2020-06-29 DIAGNOSIS — M940 Chondrocostal junction syndrome [Tietze]: Secondary | ICD-10-CM | POA: Diagnosis not present

## 2020-06-29 DIAGNOSIS — R079 Chest pain, unspecified: Secondary | ICD-10-CM | POA: Diagnosis not present

## 2020-06-29 DIAGNOSIS — R21 Rash and other nonspecific skin eruption: Secondary | ICD-10-CM | POA: Diagnosis not present

## 2020-08-28 DIAGNOSIS — B373 Candidiasis of vulva and vagina: Secondary | ICD-10-CM | POA: Diagnosis not present

## 2020-08-28 DIAGNOSIS — Z6841 Body Mass Index (BMI) 40.0 and over, adult: Secondary | ICD-10-CM | POA: Diagnosis not present

## 2022-05-10 DIAGNOSIS — R110 Nausea: Secondary | ICD-10-CM | POA: Diagnosis not present

## 2022-05-10 DIAGNOSIS — F418 Other specified anxiety disorders: Secondary | ICD-10-CM | POA: Diagnosis not present

## 2022-05-10 DIAGNOSIS — M25512 Pain in left shoulder: Secondary | ICD-10-CM | POA: Diagnosis not present

## 2022-05-10 DIAGNOSIS — A059 Bacterial foodborne intoxication, unspecified: Secondary | ICD-10-CM | POA: Diagnosis not present

## 2022-06-07 DIAGNOSIS — R600 Localized edema: Secondary | ICD-10-CM | POA: Diagnosis not present

## 2022-06-07 DIAGNOSIS — Z6841 Body Mass Index (BMI) 40.0 and over, adult: Secondary | ICD-10-CM | POA: Diagnosis not present

## 2022-06-09 DIAGNOSIS — R0789 Other chest pain: Secondary | ICD-10-CM | POA: Diagnosis not present

## 2022-06-09 DIAGNOSIS — R079 Chest pain, unspecified: Secondary | ICD-10-CM | POA: Diagnosis not present

## 2022-06-09 DIAGNOSIS — R0602 Shortness of breath: Secondary | ICD-10-CM | POA: Diagnosis not present

## 2022-06-11 DIAGNOSIS — M79602 Pain in left arm: Secondary | ICD-10-CM | POA: Diagnosis not present

## 2022-06-11 DIAGNOSIS — M7989 Other specified soft tissue disorders: Secondary | ICD-10-CM | POA: Diagnosis not present

## 2022-06-11 DIAGNOSIS — Z6841 Body Mass Index (BMI) 40.0 and over, adult: Secondary | ICD-10-CM | POA: Diagnosis not present

## 2022-06-11 DIAGNOSIS — R0789 Other chest pain: Secondary | ICD-10-CM | POA: Diagnosis not present

## 2022-06-11 DIAGNOSIS — I998 Other disorder of circulatory system: Secondary | ICD-10-CM | POA: Diagnosis not present

## 2022-06-11 DIAGNOSIS — R079 Chest pain, unspecified: Secondary | ICD-10-CM | POA: Diagnosis not present

## 2022-06-12 DIAGNOSIS — M7989 Other specified soft tissue disorders: Secondary | ICD-10-CM | POA: Diagnosis not present

## 2022-06-12 DIAGNOSIS — M79602 Pain in left arm: Secondary | ICD-10-CM | POA: Diagnosis not present

## 2022-06-12 DIAGNOSIS — R079 Chest pain, unspecified: Secondary | ICD-10-CM | POA: Diagnosis not present

## 2022-06-16 DIAGNOSIS — Z880 Allergy status to penicillin: Secondary | ICD-10-CM | POA: Diagnosis not present

## 2022-06-16 DIAGNOSIS — M79602 Pain in left arm: Secondary | ICD-10-CM | POA: Diagnosis not present

## 2022-06-16 DIAGNOSIS — R0789 Other chest pain: Secondary | ICD-10-CM | POA: Diagnosis not present

## 2022-06-16 DIAGNOSIS — Z882 Allergy status to sulfonamides status: Secondary | ICD-10-CM | POA: Diagnosis not present

## 2022-06-16 DIAGNOSIS — R079 Chest pain, unspecified: Secondary | ICD-10-CM | POA: Diagnosis not present

## 2022-06-16 DIAGNOSIS — M25512 Pain in left shoulder: Secondary | ICD-10-CM | POA: Diagnosis not present

## 2022-06-16 DIAGNOSIS — R110 Nausea: Secondary | ICD-10-CM | POA: Diagnosis not present

## 2022-06-17 DIAGNOSIS — R079 Chest pain, unspecified: Secondary | ICD-10-CM | POA: Diagnosis not present

## 2022-06-18 DIAGNOSIS — Z6841 Body Mass Index (BMI) 40.0 and over, adult: Secondary | ICD-10-CM | POA: Diagnosis not present

## 2022-06-18 DIAGNOSIS — I10 Essential (primary) hypertension: Secondary | ICD-10-CM | POA: Diagnosis not present

## 2022-06-21 DIAGNOSIS — R079 Chest pain, unspecified: Secondary | ICD-10-CM | POA: Diagnosis not present

## 2022-06-21 DIAGNOSIS — M5412 Radiculopathy, cervical region: Secondary | ICD-10-CM | POA: Diagnosis not present

## 2022-06-21 DIAGNOSIS — M25512 Pain in left shoulder: Secondary | ICD-10-CM | POA: Diagnosis not present

## 2022-06-21 DIAGNOSIS — R0609 Other forms of dyspnea: Secondary | ICD-10-CM | POA: Diagnosis not present

## 2022-06-21 DIAGNOSIS — R609 Edema, unspecified: Secondary | ICD-10-CM | POA: Diagnosis not present

## 2022-06-25 DIAGNOSIS — M79674 Pain in right toe(s): Secondary | ICD-10-CM | POA: Diagnosis not present

## 2022-06-25 DIAGNOSIS — M7989 Other specified soft tissue disorders: Secondary | ICD-10-CM | POA: Diagnosis not present

## 2022-06-25 DIAGNOSIS — Z6841 Body Mass Index (BMI) 40.0 and over, adult: Secondary | ICD-10-CM | POA: Diagnosis not present

## 2022-06-29 DIAGNOSIS — R079 Chest pain, unspecified: Secondary | ICD-10-CM | POA: Diagnosis not present

## 2022-07-01 DIAGNOSIS — R0789 Other chest pain: Secondary | ICD-10-CM | POA: Diagnosis not present

## 2022-07-01 DIAGNOSIS — R079 Chest pain, unspecified: Secondary | ICD-10-CM | POA: Diagnosis not present

## 2022-07-01 DIAGNOSIS — Z79899 Other long term (current) drug therapy: Secondary | ICD-10-CM | POA: Diagnosis not present

## 2022-07-01 DIAGNOSIS — R0602 Shortness of breath: Secondary | ICD-10-CM | POA: Diagnosis not present

## 2022-07-07 DIAGNOSIS — Z6841 Body Mass Index (BMI) 40.0 and over, adult: Secondary | ICD-10-CM | POA: Diagnosis not present

## 2022-07-07 DIAGNOSIS — R600 Localized edema: Secondary | ICD-10-CM | POA: Diagnosis not present

## 2022-07-07 DIAGNOSIS — R0602 Shortness of breath: Secondary | ICD-10-CM | POA: Diagnosis not present

## 2022-07-12 DIAGNOSIS — I517 Cardiomegaly: Secondary | ICD-10-CM | POA: Diagnosis not present

## 2022-07-16 DIAGNOSIS — M25512 Pain in left shoulder: Secondary | ICD-10-CM | POA: Diagnosis not present

## 2022-07-16 DIAGNOSIS — M4004 Postural kyphosis, thoracic region: Secondary | ICD-10-CM | POA: Diagnosis not present

## 2022-07-16 DIAGNOSIS — Z6841 Body Mass Index (BMI) 40.0 and over, adult: Secondary | ICD-10-CM | POA: Diagnosis not present

## 2022-07-16 DIAGNOSIS — R609 Edema, unspecified: Secondary | ICD-10-CM | POA: Diagnosis not present

## 2022-07-16 DIAGNOSIS — R0609 Other forms of dyspnea: Secondary | ICD-10-CM | POA: Diagnosis not present

## 2022-07-16 DIAGNOSIS — M6281 Muscle weakness (generalized): Secondary | ICD-10-CM | POA: Diagnosis not present

## 2022-07-16 DIAGNOSIS — R002 Palpitations: Secondary | ICD-10-CM | POA: Diagnosis not present

## 2022-07-16 DIAGNOSIS — R079 Chest pain, unspecified: Secondary | ICD-10-CM | POA: Diagnosis not present

## 2022-07-19 DIAGNOSIS — Z1331 Encounter for screening for depression: Secondary | ICD-10-CM | POA: Diagnosis not present

## 2022-07-19 DIAGNOSIS — Z6841 Body Mass Index (BMI) 40.0 and over, adult: Secondary | ICD-10-CM | POA: Diagnosis not present

## 2022-07-19 DIAGNOSIS — R0602 Shortness of breath: Secondary | ICD-10-CM | POA: Diagnosis not present

## 2022-07-28 DIAGNOSIS — R002 Palpitations: Secondary | ICD-10-CM | POA: Diagnosis not present

## 2022-07-28 DIAGNOSIS — R079 Chest pain, unspecified: Secondary | ICD-10-CM | POA: Diagnosis not present

## 2022-07-28 DIAGNOSIS — R0789 Other chest pain: Secondary | ICD-10-CM | POA: Diagnosis not present

## 2022-07-28 DIAGNOSIS — R069 Unspecified abnormalities of breathing: Secondary | ICD-10-CM | POA: Diagnosis not present

## 2022-07-28 DIAGNOSIS — R0609 Other forms of dyspnea: Secondary | ICD-10-CM | POA: Diagnosis not present

## 2022-08-02 DIAGNOSIS — G4733 Obstructive sleep apnea (adult) (pediatric): Secondary | ICD-10-CM | POA: Diagnosis not present

## 2022-08-02 DIAGNOSIS — R400 Somnolence: Secondary | ICD-10-CM | POA: Diagnosis not present

## 2022-08-02 DIAGNOSIS — J301 Allergic rhinitis due to pollen: Secondary | ICD-10-CM | POA: Diagnosis not present

## 2022-08-02 DIAGNOSIS — R5383 Other fatigue: Secondary | ICD-10-CM | POA: Diagnosis not present

## 2022-08-02 DIAGNOSIS — J4540 Moderate persistent asthma, uncomplicated: Secondary | ICD-10-CM | POA: Diagnosis not present

## 2022-08-03 DIAGNOSIS — M7542 Impingement syndrome of left shoulder: Secondary | ICD-10-CM | POA: Diagnosis not present

## 2022-08-10 DIAGNOSIS — M25512 Pain in left shoulder: Secondary | ICD-10-CM | POA: Diagnosis not present

## 2022-08-10 DIAGNOSIS — M19012 Primary osteoarthritis, left shoulder: Secondary | ICD-10-CM | POA: Diagnosis not present

## 2022-08-16 DIAGNOSIS — R400 Somnolence: Secondary | ICD-10-CM | POA: Diagnosis not present

## 2022-08-16 DIAGNOSIS — J301 Allergic rhinitis due to pollen: Secondary | ICD-10-CM | POA: Diagnosis not present

## 2022-08-16 DIAGNOSIS — J4540 Moderate persistent asthma, uncomplicated: Secondary | ICD-10-CM | POA: Diagnosis not present

## 2022-08-16 DIAGNOSIS — R5383 Other fatigue: Secondary | ICD-10-CM | POA: Diagnosis not present

## 2022-08-16 DIAGNOSIS — G4733 Obstructive sleep apnea (adult) (pediatric): Secondary | ICD-10-CM | POA: Diagnosis not present

## 2022-08-19 DIAGNOSIS — J4540 Moderate persistent asthma, uncomplicated: Secondary | ICD-10-CM | POA: Diagnosis not present

## 2022-08-19 DIAGNOSIS — E559 Vitamin D deficiency, unspecified: Secondary | ICD-10-CM | POA: Diagnosis not present

## 2022-08-19 DIAGNOSIS — I471 Supraventricular tachycardia: Secondary | ICD-10-CM | POA: Diagnosis not present

## 2022-08-19 DIAGNOSIS — R5383 Other fatigue: Secondary | ICD-10-CM | POA: Diagnosis not present

## 2022-08-20 DIAGNOSIS — M7542 Impingement syndrome of left shoulder: Secondary | ICD-10-CM | POA: Diagnosis not present

## 2022-08-20 DIAGNOSIS — M25512 Pain in left shoulder: Secondary | ICD-10-CM | POA: Diagnosis not present

## 2022-08-30 DIAGNOSIS — R5383 Other fatigue: Secondary | ICD-10-CM | POA: Diagnosis not present

## 2022-08-30 DIAGNOSIS — J4550 Severe persistent asthma, uncomplicated: Secondary | ICD-10-CM | POA: Diagnosis not present

## 2022-08-30 DIAGNOSIS — J301 Allergic rhinitis due to pollen: Secondary | ICD-10-CM | POA: Diagnosis not present

## 2022-08-30 DIAGNOSIS — G4733 Obstructive sleep apnea (adult) (pediatric): Secondary | ICD-10-CM | POA: Diagnosis not present

## 2022-08-30 DIAGNOSIS — R400 Somnolence: Secondary | ICD-10-CM | POA: Diagnosis not present

## 2022-09-01 DIAGNOSIS — M25512 Pain in left shoulder: Secondary | ICD-10-CM | POA: Diagnosis not present

## 2022-09-01 DIAGNOSIS — M7542 Impingement syndrome of left shoulder: Secondary | ICD-10-CM | POA: Diagnosis not present

## 2022-09-02 DIAGNOSIS — M25512 Pain in left shoulder: Secondary | ICD-10-CM | POA: Diagnosis not present

## 2022-09-02 DIAGNOSIS — M7542 Impingement syndrome of left shoulder: Secondary | ICD-10-CM | POA: Diagnosis not present

## 2022-09-03 DIAGNOSIS — M7542 Impingement syndrome of left shoulder: Secondary | ICD-10-CM | POA: Diagnosis not present

## 2022-09-03 DIAGNOSIS — M25512 Pain in left shoulder: Secondary | ICD-10-CM | POA: Diagnosis not present

## 2022-09-14 DIAGNOSIS — G4733 Obstructive sleep apnea (adult) (pediatric): Secondary | ICD-10-CM | POA: Diagnosis not present

## 2022-09-27 DIAGNOSIS — G4733 Obstructive sleep apnea (adult) (pediatric): Secondary | ICD-10-CM | POA: Diagnosis not present

## 2022-09-27 DIAGNOSIS — J301 Allergic rhinitis due to pollen: Secondary | ICD-10-CM | POA: Diagnosis not present

## 2022-09-27 DIAGNOSIS — R400 Somnolence: Secondary | ICD-10-CM | POA: Diagnosis not present

## 2022-09-27 DIAGNOSIS — J4550 Severe persistent asthma, uncomplicated: Secondary | ICD-10-CM | POA: Diagnosis not present

## 2022-10-01 DIAGNOSIS — M25512 Pain in left shoulder: Secondary | ICD-10-CM | POA: Diagnosis not present

## 2022-10-01 DIAGNOSIS — M7542 Impingement syndrome of left shoulder: Secondary | ICD-10-CM | POA: Diagnosis not present

## 2022-10-02 DIAGNOSIS — M25512 Pain in left shoulder: Secondary | ICD-10-CM | POA: Diagnosis not present

## 2022-10-02 DIAGNOSIS — M7542 Impingement syndrome of left shoulder: Secondary | ICD-10-CM | POA: Diagnosis not present

## 2022-10-11 DIAGNOSIS — G4733 Obstructive sleep apnea (adult) (pediatric): Secondary | ICD-10-CM | POA: Diagnosis not present

## 2022-10-11 DIAGNOSIS — J40 Bronchitis, not specified as acute or chronic: Secondary | ICD-10-CM | POA: Diagnosis not present

## 2022-10-11 DIAGNOSIS — J329 Chronic sinusitis, unspecified: Secondary | ICD-10-CM | POA: Diagnosis not present

## 2022-10-15 DIAGNOSIS — R609 Edema, unspecified: Secondary | ICD-10-CM | POA: Diagnosis not present

## 2022-10-15 DIAGNOSIS — R002 Palpitations: Secondary | ICD-10-CM | POA: Diagnosis not present

## 2022-10-15 DIAGNOSIS — R0609 Other forms of dyspnea: Secondary | ICD-10-CM | POA: Diagnosis not present

## 2022-10-15 DIAGNOSIS — I471 Supraventricular tachycardia, unspecified: Secondary | ICD-10-CM | POA: Diagnosis not present

## 2022-10-25 DIAGNOSIS — J4550 Severe persistent asthma, uncomplicated: Secondary | ICD-10-CM | POA: Diagnosis not present

## 2022-10-25 DIAGNOSIS — J301 Allergic rhinitis due to pollen: Secondary | ICD-10-CM | POA: Diagnosis not present

## 2022-10-25 DIAGNOSIS — G4733 Obstructive sleep apnea (adult) (pediatric): Secondary | ICD-10-CM | POA: Diagnosis not present

## 2022-10-25 DIAGNOSIS — R400 Somnolence: Secondary | ICD-10-CM | POA: Diagnosis not present

## 2022-10-25 DIAGNOSIS — R5383 Other fatigue: Secondary | ICD-10-CM | POA: Diagnosis not present

## 2022-11-01 DIAGNOSIS — M25512 Pain in left shoulder: Secondary | ICD-10-CM | POA: Diagnosis not present

## 2022-11-01 DIAGNOSIS — M7542 Impingement syndrome of left shoulder: Secondary | ICD-10-CM | POA: Diagnosis not present

## 2022-11-02 DIAGNOSIS — M7542 Impingement syndrome of left shoulder: Secondary | ICD-10-CM | POA: Diagnosis not present

## 2022-11-02 DIAGNOSIS — M25512 Pain in left shoulder: Secondary | ICD-10-CM | POA: Diagnosis not present

## 2022-12-01 DIAGNOSIS — M25512 Pain in left shoulder: Secondary | ICD-10-CM | POA: Diagnosis not present

## 2022-12-01 DIAGNOSIS — M7542 Impingement syndrome of left shoulder: Secondary | ICD-10-CM | POA: Diagnosis not present

## 2022-12-02 DIAGNOSIS — M7542 Impingement syndrome of left shoulder: Secondary | ICD-10-CM | POA: Diagnosis not present

## 2022-12-02 DIAGNOSIS — M25512 Pain in left shoulder: Secondary | ICD-10-CM | POA: Diagnosis not present

## 2022-12-13 DIAGNOSIS — R0981 Nasal congestion: Secondary | ICD-10-CM | POA: Diagnosis not present

## 2022-12-13 DIAGNOSIS — R051 Acute cough: Secondary | ICD-10-CM | POA: Diagnosis not present

## 2022-12-13 DIAGNOSIS — J209 Acute bronchitis, unspecified: Secondary | ICD-10-CM | POA: Diagnosis not present

## 2022-12-13 DIAGNOSIS — M791 Myalgia, unspecified site: Secondary | ICD-10-CM | POA: Diagnosis not present

## 2022-12-29 DIAGNOSIS — J301 Allergic rhinitis due to pollen: Secondary | ICD-10-CM | POA: Diagnosis not present

## 2022-12-30 DIAGNOSIS — R070 Pain in throat: Secondary | ICD-10-CM | POA: Diagnosis not present

## 2022-12-30 DIAGNOSIS — J069 Acute upper respiratory infection, unspecified: Secondary | ICD-10-CM | POA: Diagnosis not present

## 2022-12-30 DIAGNOSIS — R059 Cough, unspecified: Secondary | ICD-10-CM | POA: Diagnosis not present

## 2023-01-01 DIAGNOSIS — M25612 Stiffness of left shoulder, not elsewhere classified: Secondary | ICD-10-CM | POA: Diagnosis not present

## 2023-01-01 DIAGNOSIS — M7542 Impingement syndrome of left shoulder: Secondary | ICD-10-CM | POA: Diagnosis not present

## 2023-01-02 DIAGNOSIS — M7542 Impingement syndrome of left shoulder: Secondary | ICD-10-CM | POA: Diagnosis not present

## 2023-01-02 DIAGNOSIS — M25512 Pain in left shoulder: Secondary | ICD-10-CM | POA: Diagnosis not present

## 2023-01-03 DIAGNOSIS — J069 Acute upper respiratory infection, unspecified: Secondary | ICD-10-CM | POA: Diagnosis not present

## 2023-01-03 DIAGNOSIS — R5383 Other fatigue: Secondary | ICD-10-CM | POA: Diagnosis not present

## 2023-01-03 DIAGNOSIS — R059 Cough, unspecified: Secondary | ICD-10-CM | POA: Diagnosis not present

## 2023-01-03 DIAGNOSIS — R0602 Shortness of breath: Secondary | ICD-10-CM | POA: Diagnosis not present

## 2023-01-03 DIAGNOSIS — Z20822 Contact with and (suspected) exposure to covid-19: Secondary | ICD-10-CM | POA: Diagnosis not present

## 2023-01-06 DIAGNOSIS — R0602 Shortness of breath: Secondary | ICD-10-CM | POA: Diagnosis not present

## 2023-01-06 DIAGNOSIS — Z6841 Body Mass Index (BMI) 40.0 and over, adult: Secondary | ICD-10-CM | POA: Diagnosis not present

## 2023-01-12 DIAGNOSIS — G4733 Obstructive sleep apnea (adult) (pediatric): Secondary | ICD-10-CM | POA: Diagnosis not present

## 2023-01-12 DIAGNOSIS — K140 Glossitis: Secondary | ICD-10-CM | POA: Diagnosis not present

## 2023-01-18 DIAGNOSIS — J301 Allergic rhinitis due to pollen: Secondary | ICD-10-CM | POA: Diagnosis not present

## 2023-01-18 DIAGNOSIS — R5383 Other fatigue: Secondary | ICD-10-CM | POA: Diagnosis not present

## 2023-01-18 DIAGNOSIS — R400 Somnolence: Secondary | ICD-10-CM | POA: Diagnosis not present

## 2023-01-18 DIAGNOSIS — J4550 Severe persistent asthma, uncomplicated: Secondary | ICD-10-CM | POA: Diagnosis not present

## 2023-01-18 DIAGNOSIS — G4733 Obstructive sleep apnea (adult) (pediatric): Secondary | ICD-10-CM | POA: Diagnosis not present

## 2023-01-25 DIAGNOSIS — J301 Allergic rhinitis due to pollen: Secondary | ICD-10-CM | POA: Diagnosis not present

## 2023-02-01 DIAGNOSIS — J301 Allergic rhinitis due to pollen: Secondary | ICD-10-CM | POA: Diagnosis not present

## 2023-02-01 DIAGNOSIS — M25512 Pain in left shoulder: Secondary | ICD-10-CM | POA: Diagnosis not present

## 2023-02-01 DIAGNOSIS — M7542 Impingement syndrome of left shoulder: Secondary | ICD-10-CM | POA: Diagnosis not present

## 2023-02-02 DIAGNOSIS — M7542 Impingement syndrome of left shoulder: Secondary | ICD-10-CM | POA: Diagnosis not present

## 2023-02-02 DIAGNOSIS — M25512 Pain in left shoulder: Secondary | ICD-10-CM | POA: Diagnosis not present

## 2023-02-08 DIAGNOSIS — J301 Allergic rhinitis due to pollen: Secondary | ICD-10-CM | POA: Diagnosis not present

## 2023-02-15 DIAGNOSIS — J301 Allergic rhinitis due to pollen: Secondary | ICD-10-CM | POA: Diagnosis not present

## 2023-02-22 DIAGNOSIS — J301 Allergic rhinitis due to pollen: Secondary | ICD-10-CM | POA: Diagnosis not present

## 2023-03-01 DIAGNOSIS — J301 Allergic rhinitis due to pollen: Secondary | ICD-10-CM | POA: Diagnosis not present

## 2023-03-02 DIAGNOSIS — M7542 Impingement syndrome of left shoulder: Secondary | ICD-10-CM | POA: Diagnosis not present

## 2023-03-02 DIAGNOSIS — M25512 Pain in left shoulder: Secondary | ICD-10-CM | POA: Diagnosis not present

## 2023-03-03 DIAGNOSIS — M25512 Pain in left shoulder: Secondary | ICD-10-CM | POA: Diagnosis not present

## 2023-03-03 DIAGNOSIS — M7542 Impingement syndrome of left shoulder: Secondary | ICD-10-CM | POA: Diagnosis not present

## 2023-03-08 DIAGNOSIS — J301 Allergic rhinitis due to pollen: Secondary | ICD-10-CM | POA: Diagnosis not present

## 2023-03-15 DIAGNOSIS — J301 Allergic rhinitis due to pollen: Secondary | ICD-10-CM | POA: Diagnosis not present

## 2023-03-22 DIAGNOSIS — J301 Allergic rhinitis due to pollen: Secondary | ICD-10-CM | POA: Diagnosis not present

## 2023-03-29 DIAGNOSIS — J301 Allergic rhinitis due to pollen: Secondary | ICD-10-CM | POA: Diagnosis not present

## 2023-04-02 DIAGNOSIS — M7542 Impingement syndrome of left shoulder: Secondary | ICD-10-CM | POA: Diagnosis not present

## 2023-04-02 DIAGNOSIS — M25512 Pain in left shoulder: Secondary | ICD-10-CM | POA: Diagnosis not present

## 2023-04-03 DIAGNOSIS — M25512 Pain in left shoulder: Secondary | ICD-10-CM | POA: Diagnosis not present

## 2023-04-03 DIAGNOSIS — M7542 Impingement syndrome of left shoulder: Secondary | ICD-10-CM | POA: Diagnosis not present

## 2023-04-06 DIAGNOSIS — J301 Allergic rhinitis due to pollen: Secondary | ICD-10-CM | POA: Diagnosis not present

## 2023-04-12 DIAGNOSIS — J301 Allergic rhinitis due to pollen: Secondary | ICD-10-CM | POA: Diagnosis not present

## 2023-04-19 DIAGNOSIS — G4733 Obstructive sleep apnea (adult) (pediatric): Secondary | ICD-10-CM | POA: Diagnosis not present

## 2023-04-19 DIAGNOSIS — J4550 Severe persistent asthma, uncomplicated: Secondary | ICD-10-CM | POA: Diagnosis not present

## 2023-04-19 DIAGNOSIS — J301 Allergic rhinitis due to pollen: Secondary | ICD-10-CM | POA: Diagnosis not present

## 2023-04-19 DIAGNOSIS — R400 Somnolence: Secondary | ICD-10-CM | POA: Diagnosis not present

## 2023-04-26 DIAGNOSIS — J301 Allergic rhinitis due to pollen: Secondary | ICD-10-CM | POA: Diagnosis not present

## 2023-05-02 DIAGNOSIS — M25512 Pain in left shoulder: Secondary | ICD-10-CM | POA: Diagnosis not present

## 2023-05-02 DIAGNOSIS — M7542 Impingement syndrome of left shoulder: Secondary | ICD-10-CM | POA: Diagnosis not present

## 2023-05-03 DIAGNOSIS — J301 Allergic rhinitis due to pollen: Secondary | ICD-10-CM | POA: Diagnosis not present

## 2023-05-03 DIAGNOSIS — M7542 Impingement syndrome of left shoulder: Secondary | ICD-10-CM | POA: Diagnosis not present

## 2023-05-03 DIAGNOSIS — M25512 Pain in left shoulder: Secondary | ICD-10-CM | POA: Diagnosis not present

## 2023-05-10 DIAGNOSIS — J301 Allergic rhinitis due to pollen: Secondary | ICD-10-CM | POA: Diagnosis not present

## 2023-05-18 DIAGNOSIS — J301 Allergic rhinitis due to pollen: Secondary | ICD-10-CM | POA: Diagnosis not present

## 2023-05-24 DIAGNOSIS — J301 Allergic rhinitis due to pollen: Secondary | ICD-10-CM | POA: Diagnosis not present

## 2023-05-31 DIAGNOSIS — J301 Allergic rhinitis due to pollen: Secondary | ICD-10-CM | POA: Diagnosis not present

## 2023-06-02 DIAGNOSIS — M7542 Impingement syndrome of left shoulder: Secondary | ICD-10-CM | POA: Diagnosis not present

## 2023-06-02 DIAGNOSIS — M25512 Pain in left shoulder: Secondary | ICD-10-CM | POA: Diagnosis not present

## 2023-06-03 DIAGNOSIS — M25512 Pain in left shoulder: Secondary | ICD-10-CM | POA: Diagnosis not present

## 2023-06-03 DIAGNOSIS — M7542 Impingement syndrome of left shoulder: Secondary | ICD-10-CM | POA: Diagnosis not present

## 2023-06-07 DIAGNOSIS — J301 Allergic rhinitis due to pollen: Secondary | ICD-10-CM | POA: Diagnosis not present

## 2023-06-14 DIAGNOSIS — J301 Allergic rhinitis due to pollen: Secondary | ICD-10-CM | POA: Diagnosis not present

## 2023-06-21 DIAGNOSIS — J301 Allergic rhinitis due to pollen: Secondary | ICD-10-CM | POA: Diagnosis not present

## 2023-06-28 DIAGNOSIS — J301 Allergic rhinitis due to pollen: Secondary | ICD-10-CM | POA: Diagnosis not present

## 2023-07-02 DIAGNOSIS — M7542 Impingement syndrome of left shoulder: Secondary | ICD-10-CM | POA: Diagnosis not present

## 2023-07-02 DIAGNOSIS — M25512 Pain in left shoulder: Secondary | ICD-10-CM | POA: Diagnosis not present

## 2023-07-03 DIAGNOSIS — M7542 Impingement syndrome of left shoulder: Secondary | ICD-10-CM | POA: Diagnosis not present

## 2023-07-03 DIAGNOSIS — M25512 Pain in left shoulder: Secondary | ICD-10-CM | POA: Diagnosis not present

## 2023-07-06 DIAGNOSIS — J301 Allergic rhinitis due to pollen: Secondary | ICD-10-CM | POA: Diagnosis not present

## 2023-07-13 DIAGNOSIS — J301 Allergic rhinitis due to pollen: Secondary | ICD-10-CM | POA: Diagnosis not present

## 2023-07-20 DIAGNOSIS — J301 Allergic rhinitis due to pollen: Secondary | ICD-10-CM | POA: Diagnosis not present

## 2023-07-27 DIAGNOSIS — J301 Allergic rhinitis due to pollen: Secondary | ICD-10-CM | POA: Diagnosis not present

## 2023-08-02 DIAGNOSIS — M7542 Impingement syndrome of left shoulder: Secondary | ICD-10-CM | POA: Diagnosis not present

## 2023-08-02 DIAGNOSIS — M25512 Pain in left shoulder: Secondary | ICD-10-CM | POA: Diagnosis not present

## 2023-08-02 DIAGNOSIS — J301 Allergic rhinitis due to pollen: Secondary | ICD-10-CM | POA: Diagnosis not present

## 2023-08-03 DIAGNOSIS — M25512 Pain in left shoulder: Secondary | ICD-10-CM | POA: Diagnosis not present

## 2023-08-03 DIAGNOSIS — M7542 Impingement syndrome of left shoulder: Secondary | ICD-10-CM | POA: Diagnosis not present

## 2023-08-17 DIAGNOSIS — J301 Allergic rhinitis due to pollen: Secondary | ICD-10-CM | POA: Diagnosis not present

## 2023-08-24 DIAGNOSIS — J301 Allergic rhinitis due to pollen: Secondary | ICD-10-CM | POA: Diagnosis not present

## 2023-08-31 DIAGNOSIS — J301 Allergic rhinitis due to pollen: Secondary | ICD-10-CM | POA: Diagnosis not present

## 2023-09-07 DIAGNOSIS — J301 Allergic rhinitis due to pollen: Secondary | ICD-10-CM | POA: Diagnosis not present

## 2023-09-08 DIAGNOSIS — J301 Allergic rhinitis due to pollen: Secondary | ICD-10-CM | POA: Diagnosis not present

## 2023-09-14 DIAGNOSIS — J301 Allergic rhinitis due to pollen: Secondary | ICD-10-CM | POA: Diagnosis not present

## 2023-09-28 DIAGNOSIS — J301 Allergic rhinitis due to pollen: Secondary | ICD-10-CM | POA: Diagnosis not present

## 2023-10-05 DIAGNOSIS — J301 Allergic rhinitis due to pollen: Secondary | ICD-10-CM | POA: Diagnosis not present

## 2023-10-12 DIAGNOSIS — J301 Allergic rhinitis due to pollen: Secondary | ICD-10-CM | POA: Diagnosis not present

## 2023-10-19 DIAGNOSIS — J301 Allergic rhinitis due to pollen: Secondary | ICD-10-CM | POA: Diagnosis not present

## 2023-10-26 DIAGNOSIS — J301 Allergic rhinitis due to pollen: Secondary | ICD-10-CM | POA: Diagnosis not present

## 2023-11-16 DIAGNOSIS — J301 Allergic rhinitis due to pollen: Secondary | ICD-10-CM | POA: Diagnosis not present

## 2023-11-23 DIAGNOSIS — J301 Allergic rhinitis due to pollen: Secondary | ICD-10-CM | POA: Diagnosis not present

## 2023-12-06 DIAGNOSIS — J301 Allergic rhinitis due to pollen: Secondary | ICD-10-CM | POA: Diagnosis not present

## 2023-12-20 DIAGNOSIS — J301 Allergic rhinitis due to pollen: Secondary | ICD-10-CM | POA: Diagnosis not present

## 2023-12-28 DIAGNOSIS — J301 Allergic rhinitis due to pollen: Secondary | ICD-10-CM | POA: Diagnosis not present

## 2024-01-11 DIAGNOSIS — J301 Allergic rhinitis due to pollen: Secondary | ICD-10-CM | POA: Diagnosis not present

## 2024-01-18 DIAGNOSIS — J301 Allergic rhinitis due to pollen: Secondary | ICD-10-CM | POA: Diagnosis not present

## 2024-02-01 DIAGNOSIS — J301 Allergic rhinitis due to pollen: Secondary | ICD-10-CM | POA: Diagnosis not present

## 2024-02-08 DIAGNOSIS — J301 Allergic rhinitis due to pollen: Secondary | ICD-10-CM | POA: Diagnosis not present

## 2024-03-06 DIAGNOSIS — J301 Allergic rhinitis due to pollen: Secondary | ICD-10-CM | POA: Diagnosis not present

## 2024-03-13 DIAGNOSIS — J301 Allergic rhinitis due to pollen: Secondary | ICD-10-CM | POA: Diagnosis not present

## 2024-03-27 DIAGNOSIS — J301 Allergic rhinitis due to pollen: Secondary | ICD-10-CM | POA: Diagnosis not present

## 2024-04-03 DIAGNOSIS — J301 Allergic rhinitis due to pollen: Secondary | ICD-10-CM | POA: Diagnosis not present

## 2024-06-25 DIAGNOSIS — R0789 Other chest pain: Secondary | ICD-10-CM | POA: Diagnosis not present

## 2024-06-25 DIAGNOSIS — R079 Chest pain, unspecified: Secondary | ICD-10-CM | POA: Diagnosis not present

## 2024-06-25 DIAGNOSIS — R002 Palpitations: Secondary | ICD-10-CM | POA: Diagnosis not present

## 2024-09-03 DIAGNOSIS — R0789 Other chest pain: Secondary | ICD-10-CM | POA: Diagnosis not present

## 2024-09-03 DIAGNOSIS — R079 Chest pain, unspecified: Secondary | ICD-10-CM | POA: Diagnosis not present

## 2024-09-03 DIAGNOSIS — R0609 Other forms of dyspnea: Secondary | ICD-10-CM | POA: Diagnosis not present

## 2024-09-03 DIAGNOSIS — R0602 Shortness of breath: Secondary | ICD-10-CM | POA: Diagnosis not present

## 2024-09-06 DIAGNOSIS — Z6839 Body mass index (BMI) 39.0-39.9, adult: Secondary | ICD-10-CM | POA: Diagnosis not present

## 2024-09-06 DIAGNOSIS — R0602 Shortness of breath: Secondary | ICD-10-CM | POA: Diagnosis not present

## 2024-09-06 DIAGNOSIS — R600 Localized edema: Secondary | ICD-10-CM | POA: Diagnosis not present

## 2024-09-12 DIAGNOSIS — R0602 Shortness of breath: Secondary | ICD-10-CM | POA: Diagnosis not present

## 2024-10-29 DIAGNOSIS — Z6841 Body Mass Index (BMI) 40.0 and over, adult: Secondary | ICD-10-CM | POA: Diagnosis not present

## 2024-10-29 DIAGNOSIS — K648 Other hemorrhoids: Secondary | ICD-10-CM | POA: Diagnosis not present

## 2024-12-03 DIAGNOSIS — K648 Other hemorrhoids: Secondary | ICD-10-CM | POA: Diagnosis not present

## 2024-12-03 DIAGNOSIS — K5904 Chronic idiopathic constipation: Secondary | ICD-10-CM | POA: Diagnosis not present

## 2024-12-03 DIAGNOSIS — Z79899 Other long term (current) drug therapy: Secondary | ICD-10-CM | POA: Diagnosis not present

## 2024-12-03 DIAGNOSIS — Z6841 Body Mass Index (BMI) 40.0 and over, adult: Secondary | ICD-10-CM | POA: Diagnosis not present

## 2024-12-03 DIAGNOSIS — R030 Elevated blood-pressure reading, without diagnosis of hypertension: Secondary | ICD-10-CM | POA: Diagnosis not present

## 2024-12-05 DIAGNOSIS — R0981 Nasal congestion: Secondary | ICD-10-CM | POA: Diagnosis not present

## 2024-12-05 DIAGNOSIS — R059 Cough, unspecified: Secondary | ICD-10-CM | POA: Diagnosis not present
